# Patient Record
Sex: Male | Born: 1960
Health system: Southern US, Community
[De-identification: ages and names within clinical notes are randomized; demographics above are authoritative.]

## PROBLEM LIST (undated history)

## (undated) DIAGNOSIS — F39 Unspecified mood [affective] disorder: Secondary | ICD-10-CM

## (undated) DIAGNOSIS — E785 Hyperlipidemia, unspecified: Secondary | ICD-10-CM

## (undated) DIAGNOSIS — F329 Major depressive disorder, single episode, unspecified: Secondary | ICD-10-CM

## (undated) DIAGNOSIS — F419 Anxiety disorder, unspecified: Secondary | ICD-10-CM

## (undated) DIAGNOSIS — F32A Depression, unspecified: Secondary | ICD-10-CM

## (undated) HISTORY — DX: Major depressive disorder, single episode, unspecified: F32.9

## (undated) HISTORY — PX: OTHER SURGICAL HISTORY: SHX169

## (undated) HISTORY — PX: SHOULDER SURGERY: SHX246

## (undated) HISTORY — DX: Anxiety disorder, unspecified: F41.9

## (undated) HISTORY — DX: Hyperlipidemia, unspecified: E78.5

## (undated) HISTORY — DX: Depression, unspecified: F32.A

---

## 2009-04-20 ENCOUNTER — Ambulatory Visit: Payer: Self-pay | Admitting: Family Medicine

## 2009-04-20 ENCOUNTER — Encounter: Payer: Self-pay | Admitting: Occupational Medicine

## 2009-04-20 DIAGNOSIS — R110 Nausea: Secondary | ICD-10-CM | POA: Insufficient documentation

## 2009-04-20 DIAGNOSIS — R197 Diarrhea, unspecified: Secondary | ICD-10-CM

## 2009-04-20 DIAGNOSIS — R1011 Right upper quadrant pain: Secondary | ICD-10-CM | POA: Insufficient documentation

## 2009-04-20 LAB — CONVERTED CEMR LAB
ALT: 41 units/L (ref 0–53)
AST: 35 units/L (ref 0–37)
Albumin: 4.8 g/dL (ref 3.5–5.2)
Calcium: 9.9 mg/dL (ref 8.4–10.5)
Chloride: 102 meq/L (ref 96–112)
HCT: 43.2 % (ref 39.0–52.0)
Lymphocytes Relative: 29 % (ref 12–46)
Lymphs Abs: 1.7 10*3/uL (ref 0.7–4.0)
Neutrophils Relative %: 53 % (ref 43–77)
Platelets: 192 10*3/uL (ref 150–400)
Potassium: 4.6 meq/L (ref 3.5–5.3)
RBC: 4.81 M/uL (ref 4.22–5.81)
Total Protein: 7.6 g/dL (ref 6.0–8.3)
WBC: 5.7 10*3/uL (ref 4.0–10.5)

## 2009-04-21 ENCOUNTER — Telehealth: Payer: Self-pay | Admitting: Occupational Medicine

## 2009-04-21 ENCOUNTER — Emergency Department (HOSPITAL_BASED_OUTPATIENT_CLINIC_OR_DEPARTMENT_OTHER): Admission: EM | Admit: 2009-04-21 | Discharge: 2009-04-21 | Payer: Self-pay | Admitting: Emergency Medicine

## 2009-04-21 ENCOUNTER — Ambulatory Visit: Payer: Self-pay | Admitting: Diagnostic Radiology

## 2010-06-23 ENCOUNTER — Ambulatory Visit (HOSPITAL_COMMUNITY): Payer: Self-pay | Admitting: Psychology

## 2010-09-23 ENCOUNTER — Ambulatory Visit (HOSPITAL_COMMUNITY): Admit: 2010-09-23 | Payer: Self-pay | Admitting: Psychiatry

## 2010-09-23 ENCOUNTER — Ambulatory Visit (INDEPENDENT_AMBULATORY_CARE_PROVIDER_SITE_OTHER): Payer: 59 | Admitting: Psychiatry

## 2010-09-23 DIAGNOSIS — F411 Generalized anxiety disorder: Secondary | ICD-10-CM

## 2010-09-23 DIAGNOSIS — F339 Major depressive disorder, recurrent, unspecified: Secondary | ICD-10-CM

## 2010-10-12 ENCOUNTER — Ambulatory Visit (HOSPITAL_COMMUNITY): Payer: 59 | Admitting: Psychology

## 2010-10-12 ENCOUNTER — Encounter (HOSPITAL_COMMUNITY): Payer: 59 | Admitting: Psychology

## 2010-11-06 ENCOUNTER — Encounter (INDEPENDENT_AMBULATORY_CARE_PROVIDER_SITE_OTHER): Payer: 59 | Admitting: Psychiatry

## 2010-11-06 DIAGNOSIS — F339 Major depressive disorder, recurrent, unspecified: Secondary | ICD-10-CM

## 2010-11-06 DIAGNOSIS — F411 Generalized anxiety disorder: Secondary | ICD-10-CM

## 2010-11-28 LAB — URINALYSIS, ROUTINE W REFLEX MICROSCOPIC
Glucose, UA: NEGATIVE mg/dL
Ketones, ur: NEGATIVE mg/dL
Specific Gravity, Urine: 1.02 (ref 1.005–1.030)
pH: 5.5 (ref 5.0–8.0)

## 2010-11-28 LAB — COMPREHENSIVE METABOLIC PANEL
ALT: 42 U/L (ref 0–53)
AST: 39 U/L — ABNORMAL HIGH (ref 0–37)
Calcium: 9.7 mg/dL (ref 8.4–10.5)
GFR calc Af Amer: >60 mL/min (ref >60)
Glucose, Bld: 73 mg/dL (ref 70–99)
Sodium: 143 mEq/L (ref 135–145)
Total Protein: 8.5 g/dL — ABNORMAL HIGH (ref 6.0–8.3)

## 2010-11-28 LAB — DIFFERENTIAL
Eosinophils Absolute: 0.5 10*3/uL (ref 0.0–0.7)
Lymphs Abs: 1.9 10*3/uL (ref 0.7–4.0)
Monocytes Relative: 11 % (ref 3–12)
Neutrophils Relative %: 56 % (ref 43–77)

## 2010-11-28 LAB — CBC
MCHC: 34.4 g/dL (ref 30.0–36.0)
RDW: 12 % (ref 11.5–15.5)

## 2011-01-07 ENCOUNTER — Encounter (INDEPENDENT_AMBULATORY_CARE_PROVIDER_SITE_OTHER): Payer: 59 | Admitting: Psychiatry

## 2011-01-07 DIAGNOSIS — F341 Dysthymic disorder: Secondary | ICD-10-CM

## 2011-02-03 LAB — HM COLONOSCOPY

## 2011-04-06 ENCOUNTER — Encounter (INDEPENDENT_AMBULATORY_CARE_PROVIDER_SITE_OTHER): Payer: 59 | Admitting: Psychiatry

## 2011-04-06 DIAGNOSIS — F339 Major depressive disorder, recurrent, unspecified: Secondary | ICD-10-CM

## 2011-08-03 ENCOUNTER — Encounter (HOSPITAL_COMMUNITY): Payer: Self-pay

## 2011-08-06 ENCOUNTER — Ambulatory Visit (INDEPENDENT_AMBULATORY_CARE_PROVIDER_SITE_OTHER): Payer: 59 | Admitting: Psychiatry

## 2011-08-06 ENCOUNTER — Encounter (HOSPITAL_COMMUNITY): Payer: Self-pay | Admitting: Psychiatry

## 2011-08-06 VITALS — BP 120/80 | Ht 68.0 in | Wt 230.0 lb

## 2011-08-06 DIAGNOSIS — F329 Major depressive disorder, single episode, unspecified: Secondary | ICD-10-CM

## 2011-08-06 DIAGNOSIS — F411 Generalized anxiety disorder: Secondary | ICD-10-CM
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
## 2011-09-24 ENCOUNTER — Emergency Department
Admission: EM | Admit: 2011-09-24 | Discharge: 2011-09-24 | Disposition: A | Discharge status: Home / Self Care | Payer: 59 | Source: Home / Self Care | Attending: Family Medicine | Admitting: Family Medicine

## 2011-09-24 ENCOUNTER — Encounter: Payer: Self-pay | Admitting: *Deleted

## 2011-09-24 DIAGNOSIS — S61512A Laceration without foreign body of left wrist, initial encounter: Secondary | ICD-10-CM

## 2011-09-24 DIAGNOSIS — S61509A Unspecified open wound of unspecified wrist, initial encounter: Secondary | ICD-10-CM

## 2011-09-24 HISTORY — DX: Unspecified mood (affective) disorder: F39

## 2011-09-24 HISTORY — DX: Hyperlipidemia, unspecified: E78.5

## 2011-09-24 NOTE — ED Notes (Signed)
Pt c/o LT wrist laceration x this AM. Tdap is up to date.

## 2011-09-24 NOTE — ED Provider Notes (Signed)
History     CSN: [id number]  Arrival date & time 09/24/11  1308   First MD Initiated Contact with Patient 09/24/11 1149      Chief Complaint  Patient presents with  . Extremity Laceration      HPI Comments: While using a hand held electric grinder this morning, the grinding disc broke and cut his left wrist.  Patient is a 51 y.o. male presenting with skin laceration. The history is provided by the patient.  Laceration  The incident occurred 3 to 5 hours ago. Pain location: left wrist. The laceration is 2 cm in size. Injury mechanism: grinding wheel. The pain has been improving since onset. He reports no foreign bodies present. His tetanus status is UTD.    Past Medical History  Diagnosis Date  . Anxiety   . Depression   . Hyperlipidemia   . Mood disorder   . Hyperlipemia     Past Surgical History  Procedure Date  . Left shoulder surgery   . Shoulder surgery     Family History  Problem Relation Age of Onset  . Bipolar disorder Mother   . Heart failure Mother   . Hypertension Mother   . Cancer Father     throat, prostate    History  Substance Use Topics  . Smoking status: Never Smoker   . Smokeless tobacco: Never Used  . Alcohol Use: No      Review of Systems  All other systems reviewed and are negative.    Allergies  Review of patient's allergies indicates no known allergies.  Home Medications   Current Outpatient Rx  Name Route Sig Dispense Refill  . ESCITALOPRAM OXALATE 10 MG PO TABS Oral Take 10 mg by mouth daily.    Marland Kitchen ROSUVASTATIN CALCIUM 10 MG PO TABS Oral Take 10 mg by mouth daily.    . SERTRALINE HCL 50 MG PO TABS Oral Take 50 mg by mouth daily.        BP 133/84  Pulse 60  Temp(Src) 98 F (36.7 C) (Oral)  Resp 18  Ht 5' 9.5" (1.765 m)  Wt 228 lb 4 oz (103.534 kg)  BMI 33.22 kg/m2  SpO2 100%  Physical Exam  Nursing note and vitals reviewed. Constitutional: He appears well-developed and well-nourished. No distress.    Musculoskeletal:       Left wrist: He exhibits tenderness and laceration. He exhibits normal range of motion, no bony tenderness, no swelling, no crepitus and no deformity.       Arms:      Left volar wrist reveals a superficial 2cm long laceration.  Wound appears clean without debris.  Wrist has full range of motion.  Distal Neurovascular function is intact.     ED Course  Procedures  Laceration Repair Discussed benefits and risks of procedure and verbal consent obtained. Using sterile technique and local digital 1% lidocaine with epinephrine, cleansed wound with Betadine followed copious lavage with normal saline.  Wound carefully inspected for debris and foreign bodies; none found.  Wound closed with #3, 4-0 interrupted nylon sutures.  Bacitracin and non-stick sterile dressing applied.  Wound precautions explained to patient.  Return for suture removal in 10 days.      1. Laceration of wrist, left       MDM  Wound precautions discussed.  Return in 10 days for suture removal.  Keep wound bandaged and change daily.  Apply Bacitracin.  Return for any signs of infection  Donna Christen, MD 09/24/11 1239

## 2011-10-01 ENCOUNTER — Emergency Department (INDEPENDENT_AMBULATORY_CARE_PROVIDER_SITE_OTHER)
Admission: EM | Admit: 2011-10-01 | Discharge: 2011-10-01 | Disposition: A | Discharge status: Home / Self Care | Payer: 59 | Source: Home / Self Care | Attending: Emergency Medicine | Admitting: Emergency Medicine

## 2011-10-01 DIAGNOSIS — S61509A Unspecified open wound of unspecified wrist, initial encounter: Secondary | ICD-10-CM

## 2011-10-01 NOTE — ED Provider Notes (Signed)
History     CSN: [id number]  Arrival date & time 10/01/11  1102   First MD Initiated Contact with Patient 10/01/11 1111      No chief complaint on file.   (Consider location/radiation/quality/duration/timing/severity/associated sxs/prior treatment) HPI He returns to clinic 7 days after his laceration repair for suture removal. He has not been having any problems, no pain, no disability with range of motion, weakness, or tingling. No fever, chills, redness or drainage.  Past Medical History  Diagnosis Date  . Anxiety   . Depression   . Hyperlipidemia   . Mood disorder   . Hyperlipemia     Past Surgical History  Procedure Date  . Left shoulder surgery   . Shoulder surgery     Family History  Problem Relation Age of Onset  . Bipolar disorder Mother   . Heart failure Mother   . Hypertension Mother   . Cancer Father     throat, prostate    History  Substance Use Topics  . Smoking status: Never Smoker   . Smokeless tobacco: Never Used  . Alcohol Use: No      Review of Systems  Allergies  Review of patient's allergies indicates no known allergies.  Home Medications   Current Outpatient Rx  Name Route Sig Dispense Refill  . ESCITALOPRAM OXALATE 10 MG PO TABS Oral Take 10 mg by mouth daily.    Marland Kitchen ROSUVASTATIN CALCIUM 10 MG PO TABS Oral Take 10 mg by mouth daily.    . SERTRALINE HCL 50 MG PO TABS Oral Take 50 mg by mouth daily.        There were no vitals taken for this visit.  Physical Exam  Nursing note and vitals reviewed. Constitutional: He is oriented to person, place, and time. He appears well-developed and well-nourished.  HENT:  Head: Normocephalic and atraumatic.  Eyes: No scleral icterus.  Neck: Neck supple.  Cardiovascular: Regular rhythm and normal heart sounds.   Pulmonary/Chest: Effort normal and breath sounds normal. No respiratory distress.  Neurological: He is alert and oriented to person, place, and time.  Skin: Skin is warm and dry.       Skin on the volar left wrist shows healing laceration. 3 sutures are intact and are removed. No wound dehiscence, no signs of infection, no tenderness.  Psychiatric: He has a normal mood and affect. His speech is normal.    ED Course  Procedures (including critical care time)  Labs Reviewed - No data to display No results found.   No diagnosis found.    MDM   3 sutures are removed without any problem. Wound precautions given. Followup as needed.   Lily Kocher, MD 10/01/11 [phone]

## 2011-11-01 ENCOUNTER — Other Ambulatory Visit (HOSPITAL_COMMUNITY): Payer: Self-pay | Admitting: Psychiatry

## 2011-11-01 MED ORDER — SERTRALINE HCL 50 MG PO TABS: 50.0000 mg | ORAL_TABLET | Freq: Every day | ORAL | Status: DC

## 2012-02-03 ENCOUNTER — Ambulatory Visit (INDEPENDENT_AMBULATORY_CARE_PROVIDER_SITE_OTHER): Payer: 59 | Admitting: Psychiatry

## 2012-02-03 ENCOUNTER — Encounter (HOSPITAL_COMMUNITY): Payer: Self-pay | Admitting: Psychiatry

## 2012-02-03 VITALS — BP 122/82 | Ht 68.0 in | Wt 220.0 lb

## 2012-02-03 DIAGNOSIS — F329 Major depressive disorder, single episode, unspecified: Secondary | ICD-10-CM

## 2012-02-03 DIAGNOSIS — F411 Generalized anxiety disorder: Secondary | ICD-10-CM

## 2012-02-03 DIAGNOSIS — F331 Major depressive disorder, recurrent, moderate: Secondary | ICD-10-CM
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
## 2012-05-08 ENCOUNTER — Telehealth (HOSPITAL_COMMUNITY): Payer: Self-pay | Admitting: Psychiatry

## 2012-05-08 MED ORDER — SERTRALINE HCL 50 MG PO TABS: 50.0000 mg | ORAL_TABLET | Freq: Every day | ORAL | Status: DC

## 2012-05-08 NOTE — Telephone Encounter (Signed)
Received fax request for Zoloft. Patient is scheduled for follow up on 08/04/2012. The patient reports he is doing well on sertraline and continues to take the medication. He denies any SI/HI or medication side effects.   PLAN: Informed patient of his follow up appointment will fill Sertraline 50 mg #30 with 2 refills.  Asked patient to call clinic with any problems.

## 2012-08-04 ENCOUNTER — Ambulatory Visit (HOSPITAL_COMMUNITY): Payer: Self-pay | Admitting: Psychiatry

## 2012-08-18 ENCOUNTER — Ambulatory Visit (INDEPENDENT_AMBULATORY_CARE_PROVIDER_SITE_OTHER): Payer: 59 | Admitting: Psychiatry

## 2012-08-18 ENCOUNTER — Encounter (HOSPITAL_COMMUNITY): Payer: Self-pay | Admitting: Psychiatry

## 2012-08-18 VITALS — BP 120/80 | Ht 68.0 in | Wt 226.0 lb

## 2012-08-18 DIAGNOSIS — F411 Generalized anxiety disorder: Secondary | ICD-10-CM

## 2012-08-18 DIAGNOSIS — F331 Major depressive disorder, recurrent, moderate: Secondary | ICD-10-CM

## 2012-08-18 NOTE — Progress Notes (Signed)
   [facility] Behavioral Health Follow-up Outpatient Visit  Shawn Lloyd Mar 16, 1961   Subjective: The patient is a 51 year old male who has been followed by [facility] since November of 2011. I have been treating him since February 2012. The patient is currently diagnosed with major depressive disorder recurrent moderate along with generalized anxiety disorder. He has been stable on Zoloft since I began treating him. At his last appointment, there were a lot of changes at work. He has a new chief and a Science writer. There was new administration. Another stressor was his son. His son graduated from college and went to a internship out [place]. The patient reports today that this son was offered a full-time position. He has been out [place] since. He did come home for a week for Christmas. His wife had a falling out with one of her best friends. She has not been the same. The patient is still undergoing changes at work. They're creating a training chief position. The patient has been asked to apply for it. If he takes it, his work schedule we'll change from 24 hour shifts into a Monday through Friday 8 hours per day job. The money will not increase, because the patient would not be paid over time. He is trying to decide. He is to have his paperwork in by the end of the day. He endorses good sleep and appetite. He feels that he is handling stress. He reports that he had to put down his 1 year old dog. There are manageable side effects of Zoloft including sexual side effects.  Filed Vitals:   08/18/12 1436  BP: 120/80    Mental Status Examination  Appearance: Casually dressed Alert: Yes Attention: good  Cooperative: Yes Eye Contact: Fair Speech: Regular rate rhythm and volume Psychomotor Activity: Normal Memory/Concentration: Intact Oriented: person, place, time/date and situation Mood: Euthymic Affect: Full Range Thought Processes and Associations: Logical Fund of Knowledge:  Fair Thought Content: No suicidal or homicidal thought Insight: Fair Judgement: Fair  Diagnosis: Generalized anxiety disorder, major depressive disorder, recurrent, moderate  Treatment Plan: I will continue the Zoloft at 50 mg daily. I will check on the patient in 3 months. Patient may call with concerns. Patient update whether or not he takes the job. Jamse Mead, MD

## 2012-09-01 ENCOUNTER — Other Ambulatory Visit (HOSPITAL_COMMUNITY): Payer: Self-pay | Admitting: Psychiatry

## 2012-09-01 MED ORDER — SERTRALINE HCL 50 MG PO TABS: 50.0000 mg | ORAL_TABLET | Freq: Every day | ORAL | Status: DC

## 2012-11-17 ENCOUNTER — Encounter (HOSPITAL_COMMUNITY): Payer: Self-pay | Admitting: Psychiatry

## 2012-11-17 ENCOUNTER — Ambulatory Visit (INDEPENDENT_AMBULATORY_CARE_PROVIDER_SITE_OTHER): Payer: 59 | Admitting: Psychiatry

## 2012-11-17 VITALS — BP 114/72 | Ht 68.0 in | Wt 222.0 lb

## 2012-11-17 DIAGNOSIS — F331 Major depressive disorder, recurrent, moderate: Secondary | ICD-10-CM

## 2012-11-17 DIAGNOSIS — F411 Generalized anxiety disorder: Secondary | ICD-10-CM

## 2012-11-17 MED ORDER — SERTRALINE HCL 50 MG PO TABS: 50.0000 mg | ORAL_TABLET | Freq: Every day | ORAL | Status: DC

## 2012-11-17 NOTE — Progress Notes (Signed)
[facility] Behavioral Health Follow-up Outpatient Visit  Shawn Lloyd 1960-09-23   Subjective: The patient is a 52 year old male who has been followed by [facility] since November of 2011. I have been treating him since February 2012. The patient is currently diagnosed with major depressive disorder recurrent moderate along with generalized anxiety disorder. At his last appointment, I did not make any changes. He presents today with his wife. 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 He states that at work they were kind of mad at him for a while. They really want him to take it. The patient continues to work 2 jobs. He works the equivalent of 90 hours per week. 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 The patient does tend to work late. He comes home and wants to get asleep. He falls asleep okay. He will wake up in the night and let the dog out. He is able to go back to sleep. He is down 4 pounds today. 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 He gets yearly physicals. He is very had his blood work done for this year but does not know the results yet. He does have an EKG and pulmonary function tests pending. His wife reports that he continues to get your double. She feels that he will take it on her. The patient does not see that he is getting irritable. He tends to shut down. The patient'is wife feels that she's last priority. They came up with a safeword this past week. If he is feeling stressed out, or if she sees it, they will say to each other "flat tire". This relieves attention. It appears to be helping. The patient denies any depression. He feels his anxiety is manageable.  Filed Vitals:   11/17/12 1016  BP: 114/72   Active Ambulatory Problems    Diagnosis Date Noted  . NAUSEA 04/20/2009  . DIARRHEA 04/20/2009  . RUQ PAIN 04/20/2009  . GAD (generalized anxiety disorder) 02/03/2012  . MDD (major  depressive disorder) 02/03/2012   Resolved Ambulatory Problems    Diagnosis Date Noted  . No Resolved Ambulatory Problems   Past Medical History  Diagnosis Date  . Anxiety   . Depression   . Hyperlipidemia   . Mood disorder   . Hyperlipemia    Current Outpatient Prescriptions on File Prior to Visit  Medication Sig Dispense Refill  . rosuvastatin (CRESTOR) 10 MG tablet Take 10 mg by mouth daily.      . [DISCONTINUED] escitalopram (LEXAPRO) 10 MG tablet Take 10 mg by mouth daily.       No current facility-administered medications on file prior to visit.   Review of Systems - General ROS: positive for  - sleep disturbance and weight loss Psychological ROS: positive for - anxiety Cardiovascular ROS: no chest pain or dyspnea on exertion Neurological ROS: negative for - headaches, memory loss or weakness  Mental Status Examination  Appearance: Casually dressed Alert: Yes Attention: good  Cooperative: Yes Eye Contact: Fair Speech: Regular rate rhythm and volume Psychomotor Activity: Normal Memory/Concentration: Intact Oriented: person, place, time/date and situation Mood: Euthymic Affect: Full Range Thought Processes and Associations: Logical Fund of Knowledge: Fair Thought Content: No suicidal or homicidal thought Insight: Fair Judgement: Fair  Diagnosis: Generalized anxiety disorder, major depressive disorder, recurrent, moderate  Treatment Plan: I will continue the Zoloft at 50 mg daily. I will check on the patient in 3  months. Patient may call with concerns. If patient's spouse are still having issues next appointment, I will refer for marriage counseling. Jamse Mead, MD

## 2013-02-14 ENCOUNTER — Encounter (HOSPITAL_COMMUNITY): Payer: Self-pay | Admitting: Psychiatry

## 2013-02-14 ENCOUNTER — Ambulatory Visit (INDEPENDENT_AMBULATORY_CARE_PROVIDER_SITE_OTHER): Payer: 59 | Admitting: Psychiatry

## 2013-02-14 VITALS — BP 116/75 | Ht 68.0 in | Wt 212.0 lb

## 2013-02-14 DIAGNOSIS — F411 Generalized anxiety disorder: Secondary | ICD-10-CM

## 2013-02-14 DIAGNOSIS — F331 Major depressive disorder, recurrent, moderate: Secondary | ICD-10-CM

## 2013-02-14 MED ORDER — SERTRALINE HCL 100 MG PO TABS: 100.0000 mg | ORAL_TABLET | Freq: Every day | ORAL | Status: DC
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
## 2013-03-11 ENCOUNTER — Other Ambulatory Visit (HOSPITAL_COMMUNITY): Payer: Self-pay | Admitting: Psychiatry

## 2013-03-28 ENCOUNTER — Ambulatory Visit (INDEPENDENT_AMBULATORY_CARE_PROVIDER_SITE_OTHER): Payer: BC Managed Care – PPO | Admitting: Psychiatry

## 2013-03-28 ENCOUNTER — Encounter (HOSPITAL_COMMUNITY): Payer: Self-pay | Admitting: Psychiatry

## 2013-03-28 VITALS — BP 118/78 | Ht 68.0 in | Wt 208.0 lb

## 2013-03-28 DIAGNOSIS — F411 Generalized anxiety disorder: Secondary | ICD-10-CM

## 2013-03-28 DIAGNOSIS — F331 Major depressive disorder, recurrent, moderate: Secondary | ICD-10-CM

## 2013-03-28 NOTE — Progress Notes (Signed)
   [facility] Behavioral Health Follow-up Outpatient Visit  KYCE GING [birth year]   Subjective: The patient is a 52 year old male who has been followed by [facility] since November of 2011. I have been treating him since February 2012. The patient is currently diagnosed with major depressive disorder recurrent moderate along with generalized anxiety disorder. At his last appointment, I increase Zoloft to 100 mg daily secondary to increased anxiety. He presents today with his wife. He continues to workout. He is down 4 more pounds. He is working a lot. He is still finding time to get out and be social. Delene Loll gone to [employer], and they socialize with friends. They have a boat and will go out on the lake. He and his wife are doing well. His anxiety is much better. His wife states that there was a time she almost packed her bags. She states that she now sees him as much happier. He's easy to get along with. He is more socially interactive. The patient feels that his anxiety is much better. He sleeping at night. He is watching what he eats. He still has trouble sleeping at the fire hall. Overall he is doing much better.  Filed Vitals:   03/28/13 1423  BP: 118/78   Active Ambulatory Problems    Diagnosis Date Noted  . NAUSEA 04/20/2009  . DIARRHEA 04/20/2009  . RUQ PAIN 04/20/2009  . GAD (generalized anxiety disorder) 02/03/2012  . MDD (major depressive disorder) 02/03/2012   Resolved Ambulatory Problems    Diagnosis Date Noted  . No Resolved Ambulatory Problems   Past Medical History  Diagnosis Date  . Anxiety   . Depression   . Hyperlipidemia   . Mood disorder   . Hyperlipemia    Current Outpatient Prescriptions on File Prior to Visit  Medication Sig Dispense Refill  . rosuvastatin (CRESTOR) 10 MG tablet Take 10 mg by mouth daily.      . sertraline (ZOLOFT) 100 MG tablet Take 1 tablet (100 mg total) by mouth daily.  30 tablet  2  . [DISCONTINUED] escitalopram  (LEXAPRO) 10 MG tablet Take 10 mg by mouth daily.       No current facility-administered medications on file prior to visit.   Review of Systems - General ROS: positive for  - sleep disturbance and weight loss Psychological ROS: positive for - anxiety Cardiovascular ROS: no chest pain or dyspnea on exertion Neurological ROS: negative for - headaches, memory loss or weakness Musculoskeletal: Strength and tone within normal limits, gait and station normal.  Mental Status Examination  Appearance: Casually dressed Alert: Yes Attention: good  Cooperative: Yes Eye Contact: Fair Speech: Regular rate rhythm and volume Psychomotor Activity: Normal Memory/Concentration: Intact Oriented: person, place, time/date and situation Mood: Euthymic Affect: Full Range Thought Processes and Associations: Logical Fund of Knowledge: Fair Thought Content: No suicidal or homicidal thought Insight: Fair Judgement: Fair  Diagnosis: Generalized anxiety disorder, major depressive disorder, recurrent, moderate  Treatment Plan: I will continue Zoloft 100 mg daily. I will see the patient back in 3 months. Patient may call with concerns. Jamse Mead, MD

## 2013-05-14 ENCOUNTER — Emergency Department (INDEPENDENT_AMBULATORY_CARE_PROVIDER_SITE_OTHER): Payer: BC Managed Care – PPO

## 2013-05-14 ENCOUNTER — Encounter: Payer: Self-pay | Admitting: Emergency Medicine

## 2013-05-14 ENCOUNTER — Emergency Department
Admission: EM | Admit: 2013-05-14 | Discharge: 2013-05-14 | Disposition: A | Discharge status: Home / Self Care | Payer: BC Managed Care – PPO | Source: Home / Self Care | Attending: Emergency Medicine | Admitting: Emergency Medicine

## 2013-05-14 DIAGNOSIS — R079 Chest pain, unspecified: Secondary | ICD-10-CM

## 2013-05-14 DIAGNOSIS — R071 Chest pain on breathing: Secondary | ICD-10-CM

## 2013-05-14 NOTE — ED Notes (Signed)
Patient c/o chest pain started this morning at 6 am. Denies, nausea, sweats, or pain in arm.

## 2013-05-14 NOTE — ED Provider Notes (Signed)
CSN: [id number]     Arrival date & time 05/14/13  0932 History   First MD Initiated Contact with Patient 05/14/13 [phone]     Chief Complaint  Patient presents with  . Chest Pain   Patient is a 52 y.o. male presenting with chest pain. The history is provided by the patient and the spouse.  Chest Pain Pain location:  L lateral chest Pain quality: sharp   Pain radiates to:  Does not radiate Pain radiates to the back: no   Pain severity:  Mild Onset quality:  Gradual Duration:  3 hours Timing:  Intermittent Progression:  Improving Chronicity:  New Context: raising an arm (Left arm,)   Context: not eating   Context comment:  When taking a deep breath.--He denies any exertional chest pain Relieved by:  None tried Associated symptoms: anxiety (Occasional, mild)   Associated symptoms: no abdominal pain, no altered mental status, no back pain, no claudication, no cough, no diaphoresis, no dizziness, no dysphagia, no fever, no headache, no lower extremity edema, no nausea, no near-syncope, no numbness, no palpitations, no shortness of breath, no syncope and not vomiting   Associated symptoms comment:  He denies any current left arm or left shoulder pain, although he has some chronic arthritis left shoulder Risk factors: high cholesterol    Had normal routine exercise treadmill test with imaging at cardiologist one year ago. He also had normal routine annual ETT for firefighters to 6 months ago.  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 He is here with wife. Past Medical History  Diagnosis Date  . Anxiety   . Depression   . Hyperlipidemia   . Mood disorder   . Hyperlipemia    Past Surgical History  Procedure Laterality Date  . Left shoulder surgery    . Shoulder surgery     Family History  Problem Relation Age of Onset  . Bipolar disorder Mother   . Heart failure Mother   .  Hypertension Mother   . Cancer Father     throat, prostate   History  Substance Use Topics  . Smoking status: Never Smoker   . Smokeless tobacco: Never Used  . Alcohol Use: No    Review of Systems  Constitutional: Negative for fever and diaphoresis.  HENT: Negative for trouble swallowing.   Respiratory: Negative for cough and shortness of breath.   Cardiovascular: Positive for chest pain. Negative for palpitations, claudication, syncope and near-syncope.  Gastrointestinal: Negative for nausea, vomiting and abdominal pain.  Musculoskeletal: Negative for back pain.  Neurological: Negative for dizziness, numbness and headaches.  All other systems reviewed and are negative.    Allergies  Review of patient's allergies indicates not on file.  Home Medications   Current Outpatient Rx  Name  Route  Sig  Dispense  Refill  . rosuvastatin (CRESTOR) 10 MG tablet   Oral   Take 10 mg by mouth daily.         . sertraline (ZOLOFT) 100 MG tablet   Oral   Take 1 tablet (100 mg total) by mouth daily.   30 tablet   2    BP 132/88  Pulse 72  Temp(Src) 98.3 F (36.8 C) (Oral)  Ht 5\' 8"  (1.727 m)  Wt 198 lb (89.812 kg)  BMI 30.11 kg/m2  SpO2 99% Physical Exam  Nursing note and vitals reviewed. Constitutional: He is  oriented to person, place, and time. He appears well-developed and well-nourished. No distress.  HENT:  Head: Normocephalic and atraumatic.  Eyes: Pupils are equal, round, and reactive to light. No scleral icterus.  Neck: Normal range of motion. Neck supple. No JVD present.  Cardiovascular: Normal rate, regular rhythm and normal heart sounds.  Exam reveals no gallop and no friction rub.   No murmur heard. Pulmonary/Chest: Effort normal and breath sounds normal. No respiratory distress. He has no wheezes. He has no rales. He exhibits no tenderness.  Abdominal: Soft. He exhibits no mass. There is no tenderness. There is no rebound and no guarding.  Musculoskeletal:  Normal range of motion. He exhibits no edema and no tenderness.  Lymphadenopathy:    He has no cervical adenopathy.  Neurological: He is alert and oriented to person, place, and time. No cranial nerve deficit.  Skin: Skin is warm and dry. No rash noted.  Psychiatric: He has a normal mood and affect.    ED Course  ED EKG  Date/Time: 05/14/2013 1:51 PM Performed by: Georgina Pillion, DAVID Authorized by: Lajean Manes Interpreted by ED physician Comparison: not compared with previous ECG  Previous ECG: no previous ECG available Rhythm: sinus rhythm Ectopy comments: None Rate: normal BPM: 63 QRS axis: normal Conduction: conduction normal ST Segments: ST segments normal T Waves: T waves normal Other: no other findings Clinical impression: normal ECG   Labs Review Labs Reviewed - No data to display Imaging Review Dg Chest 2 View  05/14/2013   CLINICAL DATA:  Left anterior chest pain today  EXAM: CHEST  2 VIEW  COMPARISON:  None.  FINDINGS: No active infiltrate or effusion is seen. Mediastinal contours appear normal. The heart is within normal limits in size. No bony abnormality is seen. There is however degenerative change noted involving the left shoulder with loss of joint space and spurring.  IMPRESSION: 1. No active lung disease. 2. Degenerative change in the left shoulder.   Electronically Signed   By: Dwyane Dee M.D.   On: 05/14/2013 10:41    MDM   1. Chest pain, unspecified    We reviewed normal EKG and chest x-ray with patient and wife. Clinically, there is no evidence of cardiorespiratory cause for the chest discomfort. He likely has deep chest muscle strain or mild pleuritic inflammation, but no evidence of infection. He's had normal ETT by cardiologist within the past year. We discussed workup and treatment options. In my opinion, no further workup needed at this time unless he were to have change in symptoms. After risks, benefits, alternatives discussed, patient and wife  agree with above and the following treatment plan: Rest, symptomatic care. Ibuprofen 600 mg 3 times a day p.c. Precautions discussed. Red flags discussed. Questions invited and answered. Patient and wife voiced understanding and agreement.    Lajean Manes, MD 05/14/13 [phone]

## 2013-05-16 ENCOUNTER — Telehealth: Payer: Self-pay | Admitting: *Deleted

## 2013-05-24 ENCOUNTER — Other Ambulatory Visit (HOSPITAL_COMMUNITY): Payer: Self-pay | Admitting: Psychiatry

## 2013-06-20 ENCOUNTER — Encounter (HOSPITAL_COMMUNITY): Payer: Self-pay | Admitting: Psychiatry

## 2013-06-20 ENCOUNTER — Encounter (INDEPENDENT_AMBULATORY_CARE_PROVIDER_SITE_OTHER): Payer: Self-pay

## 2013-06-20 ENCOUNTER — Ambulatory Visit (INDEPENDENT_AMBULATORY_CARE_PROVIDER_SITE_OTHER): Payer: BC Managed Care – PPO | Admitting: Psychiatry

## 2013-06-20 VITALS — BP 112/78 | Ht 68.0 in | Wt 206.0 lb

## 2013-06-20 DIAGNOSIS — F411 Generalized anxiety disorder: Secondary | ICD-10-CM

## 2013-06-20 DIAGNOSIS — F331 Major depressive disorder, recurrent, moderate: Secondary | ICD-10-CM

## 2013-06-20 MED ORDER — SERTRALINE HCL 100 MG PO TABS: ORAL_TABLET | ORAL | Status: DC

## 2013-06-20 NOTE — Progress Notes (Signed)
   [facility] [place] [facility] Follow-up Outpatient Visit  Shawn Lloyd [birth year]   Subjective: The patient is a 52 year old male who has been followed by [facility] since November of 2011. I have been treating him since February 2012. The patient is currently diagnosed with major depressive disorder recurrent moderate along with generalized anxiety disorder. At his last appointment, I did not make any changes. He presents today with his wife. 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 They have 7 cows and 2 worsens. He last report since 3 weeks ago. Things have been very busy at work and they're looking at hiring another staff member. The patient went fishing at the beach 2 weeks ago. 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 He is sleeping "okay". He usually wakes about 2 times in the night. He is able to go back to sleep. His after the second time he goes back to sleep, he does not want to get up. The patient continues to follow a low carb diet. He is working out. He is down another 2 pounds today. He was able to go off his cholesterol medicine. His wife feels that he's doing well. There is no anxiety or depression.  Filed Vitals:   06/20/13 1343  BP: 112/78   Active Ambulatory Problems    Diagnosis Date Noted  . NAUSEA 04/20/2009  . DIARRHEA 04/20/2009  . RUQ PAIN 04/20/2009  . GAD (generalized anxiety disorder) 02/03/2012  . MDD (major depressive disorder) 02/03/2012   Resolved Ambulatory Problems    Diagnosis Date Noted  . No Resolved Ambulatory Problems   Past Medical History  Diagnosis Date  . Anxiety   . Depression   . Hyperlipidemia   . Mood disorder   . Hyperlipemia    Current Outpatient Prescriptions on File Prior to Visit  Medication Sig Dispense Refill  . [DISCONTINUED] escitalopram (LEXAPRO) 10 MG tablet Take 10 mg by mouth daily.       No current facility-administered medications on  file prior to visit.   Review of Systems - General ROS: positive for  - sleep disturbance and weight loss Psychological ROS: positive for - anxiety Cardiovascular ROS: no chest pain or dyspnea on exertion Neurological ROS: negative for - headaches, memory loss or weakness Musculoskeletal: Strength and tone within normal limits, gait and station normal.  Mental Status Examination  Appearance: Casually dressed Alert: Yes Attention: good  Cooperative: Yes Eye Contact: Fair Speech: Regular rate rhythm and volume Psychomotor Activity: Normal Memory/Concentration: Intact Oriented: person, place, time/date and situation Mood: Euthymic Affect: Full Range Thought Processes and Associations: Logical Fund of Knowledge: Fair Thought Content: No suicidal or homicidal thought Insight: Fair Judgement: Fair  Diagnosis: Generalized anxiety disorder, major depressive disorder, recurrent, moderate  Treatment Plan: I will continue Zoloft 100 mg daily. He'll return to clinic in 3 months. Patient may call with concerns. Jamse Mead, MD

## 2013-06-28 ENCOUNTER — Ambulatory Visit (HOSPITAL_COMMUNITY): Payer: Self-pay | Admitting: Psychiatry

## 2013-08-26 ENCOUNTER — Other Ambulatory Visit (HOSPITAL_COMMUNITY): Payer: Self-pay | Admitting: Psychiatry

## 2013-09-20 ENCOUNTER — Ambulatory Visit (HOSPITAL_COMMUNITY): Payer: Self-pay | Admitting: Psychiatry

## 2013-11-05 LAB — CBC AND DIFFERENTIAL
HCT: 40 % — AB (ref 41–53)
Hemoglobin: 13.4 g/dL — AB (ref 13.5–17.5)
Platelets: 250 10*3/uL (ref 150–399)
WBC: 6.8 10^3/mL

## 2013-11-05 LAB — BASIC METABOLIC PANEL
BUN: 31 mg/dL — AB (ref 4–21)
Creatinine: 0.9 mg/dL (ref 0.6–1.3)
GLUCOSE: 90 mg/dL
Potassium: 3.9 mmol/L (ref 3.4–5.3)
SODIUM: 139 mmol/L (ref 137–147)

## 2013-11-05 LAB — HEPATIC FUNCTION PANEL
ALK PHOS: 66 U/L (ref 25–125)
ALT: 25 U/L (ref 10–40)
AST: 15 U/L (ref 14–40)

## 2013-11-05 LAB — LIPID PANEL
Cholesterol: 204 mg/dL — AB (ref 0–200)
HDL: 46 mg/dL (ref 35–70)
LDL CALC: 143 mg/dL
TRIGLYCERIDES: 75 mg/dL (ref 40–160)

## 2013-11-05 LAB — PSA: PSA: 0.55

## 2013-11-05 LAB — CMP14+LP+1AC+CBC/D/PLT+TSH: CHLORIDE: 104 mmol/L

## 2013-11-28 ENCOUNTER — Ambulatory Visit (HOSPITAL_COMMUNITY): Payer: Self-pay | Admitting: Psychiatry

## 2013-11-28 ENCOUNTER — Other Ambulatory Visit (HOSPITAL_COMMUNITY): Payer: Self-pay | Admitting: Psychiatry

## 2013-11-30 ENCOUNTER — Telehealth (HOSPITAL_COMMUNITY): Payer: Self-pay

## 2013-11-30 MED ORDER — SERTRALINE HCL 100 MG PO TABS: ORAL_TABLET | ORAL | Status: DC

## 2013-11-30 NOTE — Telephone Encounter (Signed)
Called patient. Patient denies SI/HI/AVH. Will refill sertraline #30 with 2 refills.

## 2014-01-17 ENCOUNTER — Encounter: Payer: Self-pay | Admitting: Emergency Medicine

## 2014-01-17 ENCOUNTER — Emergency Department
Admission: EM | Admit: 2014-01-17 | Discharge: 2014-01-17 | Disposition: A | Discharge status: Home / Self Care | Payer: BC Managed Care – PPO | Source: Home / Self Care | Attending: Emergency Medicine | Admitting: Emergency Medicine

## 2014-01-17 DIAGNOSIS — J0100 Acute maxillary sinusitis, unspecified: Secondary | ICD-10-CM

## 2014-01-17 MED ORDER — AMOXICILLIN-POT CLAVULANATE 875-125 MG PO TABS: 1.0000 | ORAL_TABLET | Freq: Two times a day (BID) | ORAL | Status: DC

## 2014-01-17 NOTE — ED Notes (Signed)
Shawn Lloyd c/o sinus congestion, yellow drainage and right sided facial pain that started 1 week ago.

## 2014-01-17 NOTE — ED Provider Notes (Signed)
CSN: [id number]     Arrival date & time 01/17/14  1612 History   First MD Initiated Contact with Patient 01/17/14 1623     No chief complaint on file.  (Consider location/radiation/quality/duration/timing/severity/associated sxs/prior Treatment) HPI     Past Medical History  Diagnosis Date  . Anxiety   . Depression   . Hyperlipidemia   . Mood disorder   . Hyperlipemia    Past Surgical History  Procedure Laterality Date  . Left shoulder surgery    . Shoulder surgery     Family History  Problem Relation Age of Onset  . Bipolar disorder Mother   . Heart failure Mother   . Hypertension Mother   . Cancer Father     throat, prostate   History  Substance Use Topics  . Smoking status: Never Smoker   . Smokeless tobacco: Never Used  . Alcohol Use: No    Review of Systems  Allergies  Review of patient's allergies indicates not on file.  Home Medications   Prior to Admission medications   Medication Sig Start Date End Date Taking? Authorizing Provider  sertraline (ZOLOFT) 100 MG tablet TAKE 1 TABLET EVERY DAY 11/30/13   Larena Sox, MD   There were no vitals taken for this visit. Physical Exam  ED Course  Procedures (including critical care time) Labs Review Labs Reviewed - No data to display  Imaging Review No results found.   MDM  No diagnosis found. Patient was not seen by me. Patient was seen by Dr. Orson Aloe in urgent care.    Lajean Manes, MD 01/17/14 [phone]

## 2014-01-17 NOTE — ED Provider Notes (Addendum)
CSN: [id number]     Arrival date & time 01/17/14  1612 History   First MD Initiated Contact with Patient 01/17/14 1623     Chief Complaint  Patient presents with  . Sinus Problem   (Consider location/radiation/quality/duration/timing/severity/associated sxs/prior Treatment) HPI Shawn Lloyd is a 53 y.o. male who complains of onset of cold symptoms for 7 days.  The symptoms are constant and mild-moderate in severity.  Worsening, located R side of face. No history of allergic rhinitis. No sore throat No cough No pleuritic pain No wheezing No nasal congestion + post-nasal drainage +  R sinus pain/pressure (teeth pain, cheek pain, strange odor, yellow/green discharge R nostril) No chest congestion No itchy/red eyes No earache No hemoptysis No SOB No chills/sweats No fever No nausea No vomiting No abdominal pain No diarrhea No skin rashes No fatigue No myalgias No headache     Past Medical History  Diagnosis Date  . Anxiety   . Depression   . Hyperlipidemia   . Mood disorder   . Hyperlipemia    Past Surgical History  Procedure Laterality Date  . Left shoulder surgery    . Shoulder surgery     Family History  Problem Relation Age of Onset  . Bipolar disorder Mother   . Heart failure Mother   . Hypertension Mother   . Cancer Father     throat, prostate   History  Substance Use Topics  . Smoking status: Never Smoker   . Smokeless tobacco: Never Used  . Alcohol Use: No    Review of Systems  All other systems reviewed and are negative.   Allergies  Review of patient's allergies indicates no known allergies.  Home Medications   Prior to Admission medications   Medication Sig Start Date End Date Taking? Authorizing Provider  BuPROPion HCl (WELLBUTRIN PO) Take by mouth.   Yes Historical Provider, MD  amoxicillin-clavulanate (AUGMENTIN) 875-125 MG per tablet Take 1 tablet by mouth 2 (two) times daily. 01/17/14   Marlaine HindJeffrey H Macsen Nuttall, MD  sertraline (ZOLOFT) 100 MG  tablet TAKE 1 TABLET EVERY DAY 11/30/13   Larena SoxShaji J Puthuvel, MD   BP 137/89  Pulse 81  Temp(Src) 98.4 F (36.9 C) (Oral)  Resp 14  Wt 211 lb (95.709 kg)  SpO2 97% Physical Exam  Nursing note and vitals reviewed. Constitutional: He is oriented to person, place, and time. He appears well-developed and well-nourished.  HENT:  Head: Normocephalic and atraumatic.  Right Ear: Tympanic membrane, external ear and ear canal normal.  Left Ear: Tympanic membrane, external ear and ear canal normal.  Nose: Mucosal edema present. Right sinus exhibits maxillary sinus tenderness.  Mouth/Throat: No oropharyngeal exudate or posterior oropharyngeal edema.  Eyes: No scleral icterus.  Neck: Neck supple.  Cardiovascular: Regular rhythm and normal heart sounds.   Pulmonary/Chest: Effort normal and breath sounds normal. No respiratory distress.  Neurological: He is alert and oriented to person, place, and time.  Skin: Skin is warm and dry.  Psychiatric: He has a normal mood and affect. His speech is normal.    ED Course  Procedures (including critical care time) Labs Review Labs Reviewed - No data to display  Imaging Review No results found.   MDM   1. Acute maxillary sinusitis    1)  Take the prescribed antibiotic as instructed. 2)  Use nasal saline solution (over the counter) at least 3 times a day. 3)  Use over the counter decongestants like Zyrtec-D every 12 hours as needed to help with  congestion.  If you have hypertension, do not take medicines with sudafed.  4)  Can take tylenol every 6 hours or motrin every 8 hours for pain or fever. 5)  Follow up with your primary doctor if no improvement in 5-7 days, sooner if increasing pain, fever, or new symptoms.     Marlaine Hind, MD 01/17/14 1644  Marlaine Hind, MD 01/17/14 [phone]

## 2014-07-02 ENCOUNTER — Emergency Department
Admission: EM | Admit: 2014-07-02 | Discharge: 2014-07-02 | Disposition: A | Discharge status: Home / Self Care | Payer: BC Managed Care – PPO | Source: Home / Self Care | Attending: Emergency Medicine | Admitting: Emergency Medicine

## 2014-07-02 ENCOUNTER — Encounter: Payer: Self-pay | Admitting: *Deleted

## 2014-07-02 DIAGNOSIS — J209 Acute bronchitis, unspecified: Secondary | ICD-10-CM

## 2014-07-02 MED ORDER — AZITHROMYCIN 250 MG PO TABS: ORAL_TABLET | ORAL | Status: DC

## 2014-07-02 MED ORDER — PROMETHAZINE-CODEINE 6.25-10 MG/5ML PO SYRP: ORAL_SOLUTION | ORAL | Status: DC

## 2014-07-02 NOTE — ED Provider Notes (Signed)
CSN: [id number]     Arrival date & time 07/02/14  1020 History   First MD Initiated Contact with Patient 07/02/14 1038     Chief Complaint  Patient presents with  . Cough  . Nasal Congestion  . Otalgia   (Consider location/radiation/quality/duration/timing/severity/associated sxs/prior Treatment) HPI Shawn Lloyd c/o HA, cough (dry) congestion and right ear pain x 3 days. Taken Mucinex OTC. Rec'd flu vac this season  History of present illness:  Several days of worsening congestion and cough, associated with discolored sputum and fever. Has tried over-the-counter treatments without significant improvement.  Review of systems: + scratchy sore throat. +hoarseness   No shortness of breath. No chest pain. No abdominal pain. No nausea or vomiting. No diarrhea. No GU symptoms. No new rash. No eye discharge. No syncope or focal neurologic symptoms.  Past Medical History  Diagnosis Date  . Anxiety   . Depression   . Hyperlipidemia   . Mood disorder   . Hyperlipemia    Past Surgical History  Procedure Laterality Date  . Left shoulder surgery    . Shoulder surgery     Family History  Problem Relation Age of Onset  . Bipolar disorder Mother   . Heart failure Mother   . Hypertension Mother   . Cancer Father     throat, prostate   History  Substance Use Topics  . Smoking status: Never Smoker   . Smokeless tobacco: Never Used  . Alcohol Use: No    Review of Systems  All other systems reviewed and are negative.   Allergies  Review of patient's allergies indicates no known allergies.  Home Medications   Prior to Admission medications   Medication Sig Start Date End Date Taking? Authorizing Provider  azithromycin (ZITHROMAX Z-PAK) 250 MG tablet Take 2 tablets on day one, then 1 tablet daily on days 2 through 5 07/02/14   Lajean Manesavid Massey, MD  promethazine-codeine [facility](PHENERGAN WITH CODEINE) 6.25-10 MG/5ML syrup Take 1-2 teaspoons every 4-6 hours as needed for cough. May cause  drowsiness. 07/02/14   Lajean Manesavid Massey, MD  sertraline (ZOLOFT) 100 MG tablet TAKE 1 TABLET EVERY DAY 11/30/13   Larena SoxShaji J Puthuvel, MD   BP 134/83 mmHg  Pulse 73  Temp(Src) 98.6 F (37 C) (Oral)  Resp 14  Wt 221 lb (100.245 kg)  SpO2 95% Physical Exam  Constitutional: He is oriented to person, place, and time. He appears well-developed and well-nourished. No distress.  HENT:  Head: Normocephalic and atraumatic.  Right Ear: Tympanic membrane normal.  Left Ear: Tympanic membrane normal.  Nose: Mucosal edema and rhinorrhea present. Right sinus exhibits no maxillary sinus tenderness and no frontal sinus tenderness. Left sinus exhibits no maxillary sinus tenderness and no frontal sinus tenderness.  Mouth/Throat: Oropharynx is clear and moist. No oropharyngeal exudate.  Eyes: Right eye exhibits no discharge. Left eye exhibits no discharge. No scleral icterus.  Neck: Neck supple.  Cardiovascular: Normal rate, regular rhythm and normal heart sounds.   Pulmonary/Chest: No respiratory distress. He has no wheezes. He has rhonchi. He has no rales.  Lymphadenopathy:    He has no cervical adenopathy.  Neurological: He is alert and oriented to person, place, and time.  Skin: Skin is warm and dry.  Nursing note and vitals reviewed.   ED Course  Procedures (including critical care time) Labs Review Labs Reviewed - No data to display  Imaging Review No results found.   MDM   1. Acute bronchitis, unspecified organism    Treatment options discussed,  as well as risks, benefits, alternatives. Patient voiced understanding and agreement with the following plans:   Discharge Medication List as of 07/02/2014 10:51 AM    START taking these medications   Details  azithromycin (ZITHROMAX Z-PAK) 250 MG tablet Take 2 tablets on day one, then 1 tablet daily on days 2 through 5, Print    promethazine-codeine (PHENERGAN WITH CODEINE) 6.25-10 MG/5ML syrup Take 1-2 teaspoons every 4-6 hours as needed for  cough. May cause drowsiness., Print       Other sx care. mucinex Follow-up with your primary care doctor in 5-7 days if not improving, or sooner if symptoms become worse. Precautions discussed. Red flags discussed. Questions invited and answered. Patient voiced understanding and agreement.   Lajean Manesavid Massey, MD 07/02/14 [phone]

## 2014-07-02 NOTE — ED Notes (Signed)
[place]ne c/o HA, cough (dry) congestion and right ear pain x 3 days. Taken Mucinex OTC. Rec'd flu vac this season.

## 2014-10-09 ENCOUNTER — Ambulatory Visit (INDEPENDENT_AMBULATORY_CARE_PROVIDER_SITE_OTHER): Payer: BLUE CROSS/BLUE SHIELD | Admitting: Physician Assistant

## 2014-10-09 ENCOUNTER — Encounter: Payer: Self-pay | Admitting: Physician Assistant

## 2014-10-09 VITALS — BP 111/62 | HR 62 | Ht 70.0 in | Wt 226.0 lb

## 2014-10-09 DIAGNOSIS — R52 Pain, unspecified: Secondary | ICD-10-CM | POA: Diagnosis not present

## 2014-10-09 DIAGNOSIS — E785 Hyperlipidemia, unspecified: Secondary | ICD-10-CM

## 2014-10-09 DIAGNOSIS — R208 Other disturbances of skin sensation: Secondary | ICD-10-CM

## 2014-10-09 DIAGNOSIS — R200 Anesthesia of skin: Secondary | ICD-10-CM

## 2014-10-09 LAB — POCT GLYCOSYLATED HEMOGLOBIN (HGB A1C): HEMOGLOBIN A1C: 5.6

## 2014-10-09 MED ORDER — GABAPENTIN 100 MG PO CAPS: ORAL_CAPSULE | ORAL | Status: DC

## 2014-10-11 LAB — COMPLETE METABOLIC PANEL WITH GFR
ALBUMIN: 4.6 g/dL (ref 3.5–5.2)
ALK PHOS: 69 U/L (ref 39–117)
ALT: 28 U/L (ref 0–53)
AST: 22 U/L (ref 0–37)
BUN: 21 mg/dL (ref 6–23)
CO2: 29 mEq/L (ref 19–32)
Calcium: 9.4 mg/dL (ref 8.4–10.5)
Chloride: 103 mEq/L (ref 96–112)
Creat: 0.9 mg/dL (ref 0.50–1.35)
GFR, Est African American: >89 mL/min
GFR, Est Non African American: >89 mL/min
Glucose, Bld: 91 mg/dL (ref 70–99)
POTASSIUM: 4.6 meq/L (ref 3.5–5.3)
SODIUM: 138 meq/L (ref 135–145)
Total Bilirubin: 0.6 mg/dL (ref 0.2–1.2)
Total Protein: 7.2 g/dL (ref 6.0–8.3)

## 2014-10-12 LAB — TSH: TSH: 1.599 u[IU]/mL (ref 0.350–4.500)

## 2014-10-12 LAB — VITAMIN B12: Vitamin B-12: 310 pg/mL (ref 211–911)

## 2014-10-12 LAB — VITAMIN D 25 HYDROXY (VIT D DEFICIENCY, FRACTURES): VIT D 25 HYDROXY: 26 ng/mL — AB (ref 30–100)

## 2014-10-15 DIAGNOSIS — R52 Pain, unspecified: Secondary | ICD-10-CM | POA: Insufficient documentation

## 2014-10-15 DIAGNOSIS — R200 Anesthesia of skin: Secondary | ICD-10-CM | POA: Insufficient documentation

## 2014-10-15 NOTE — Progress Notes (Signed)
Subjective:    Patient ID: Shawn Lloyd, male    DOB: 06-30-1961, 54 y.o.   MRN: [id number]  HPI  Patient is a 54 year old male who presents to the clinic to establish care.  .. Active Ambulatory Problems    Diagnosis Date Noted  . NAUSEA 04/20/2009  . DIARRHEA 04/20/2009  . RUQ PAIN 04/20/2009  . GAD (generalized anxiety disorder) 02/03/2012  . MDD (major depressive disorder) 02/03/2012  . Hyperlipidemia 10/09/2014   Resolved Ambulatory Problems    Diagnosis Date Noted  . No Resolved Ambulatory Problems   Past Medical History  Diagnosis Date  . Anxiety   . Depression   . Mood disorder   . Hyperlipemia    .Marland Kitchen Family History  Problem Relation Age of Onset  . Bipolar disorder Mother   . Heart failure Mother   . Hypertension Mother   . Cancer Father     throat, prostate  . Hypertension Father   . Hyperlipidemia Father    .Marland Kitchen History   Social History  . Marital Status: Married    Spouse Name: N/A  . Number of Children: N/A  . Years of Education: N/A   Occupational History  . Not on file.   Social History Main Topics  . Smoking status: Never Smoker   . Smokeless tobacco: Never Used  . Alcohol Use: Yes  . Drug Use: No  . Sexual Activity: Yes   Other Topics Concern  . Not on file   Social History Narrative   Patient presents today with bilateral legs week, burning and numbness and tingling. He was previously seen by neurologist Dr. Rise Paganini. Per patient he had an extensive workup with numerous tests including MRI, EMG and ABIs. Everything is essentially been negative. He was sent to a vascular surgeon at one point but they did nothing. The symptoms have been going on for 3-4 years but progressively get worse. He is having more and more trouble with pain and symptoms at night. 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 He cannot get back in with his neurologist until June and really wanted to to have some relief.   Review of Systems  All  other systems reviewed and are negative.      Objective:   Physical Exam  Constitutional: He is oriented to person, place, and time. He appears well-developed and well-nourished.  HENT:  Head: Normocephalic and atraumatic.  Cardiovascular: Normal rate, regular rhythm and normal heart sounds.   Pulmonary/Chest: Effort normal and breath sounds normal.  Neurological: He is alert and oriented to person, place, and time.  Skin: Skin is dry.  Psychiatric: He has a normal mood and affect. His behavior is normal.          Assessment & Plan:  Numbness and tingling in both legs/burning pain- unclear etiology today. mircofilament testing was normal today. .. Lab Results  Component Value Date   HGBA1C 5.6 10/09/2014   No diabetes.  Will get a vitamin D, thyroid, B12 and CMP to look for any other metabolic causes of this bilateral pain.  It seems as if he's had a full workup with Dr. Verlee Rossetti, neurologist. I would like for him to follow back up with her. My nurse called the office and got him a sooner appointment in March to follow-up. Per patient seems like he's had MRI, EMG, ABI and all were essentially normal. I did give him some Neurontin to start at bedtime. Discussed side effects. If  improving symptoms could start taking twice a day and we can increase from there.  Hyperlipidemia- discuss these complete physical for fasting labs. We'll get records and anterior into system. Has not been treated with medication in the past.

## 2014-11-05 ENCOUNTER — Encounter: Payer: Self-pay | Admitting: Physician Assistant

## 2014-11-18 ENCOUNTER — Other Ambulatory Visit: Payer: Self-pay | Admitting: Physician Assistant

## 2014-11-18 ENCOUNTER — Encounter: Payer: Self-pay | Admitting: Physician Assistant

## 2014-11-18 ENCOUNTER — Ambulatory Visit (HOSPITAL_BASED_OUTPATIENT_CLINIC_OR_DEPARTMENT_OTHER)
Admission: RE | Admit: 2014-11-18 | Discharge: 2014-11-18 | Disposition: A | Discharge status: Home / Self Care | Payer: BLUE CROSS/BLUE SHIELD | Source: Ambulatory Visit | Attending: Physician Assistant | Admitting: Physician Assistant

## 2014-11-18 ENCOUNTER — Ambulatory Visit (INDEPENDENT_AMBULATORY_CARE_PROVIDER_SITE_OTHER): Payer: BLUE CROSS/BLUE SHIELD | Admitting: Physician Assistant

## 2014-11-18 ENCOUNTER — Telehealth: Payer: Self-pay | Admitting: Family Medicine

## 2014-11-18 VITALS — BP 113/70 | HR 82 | Ht 70.0 in | Wt 222.0 lb

## 2014-11-18 DIAGNOSIS — X58XXXA Exposure to other specified factors, initial encounter: Secondary | ICD-10-CM | POA: Insufficient documentation

## 2014-11-18 DIAGNOSIS — L03115 Cellulitis of right lower limb: Secondary | ICD-10-CM

## 2014-11-18 DIAGNOSIS — S8011XA Contusion of right lower leg, initial encounter: Secondary | ICD-10-CM | POA: Insufficient documentation

## 2014-11-18 DIAGNOSIS — M79604 Pain in right leg: Secondary | ICD-10-CM | POA: Insufficient documentation

## 2014-11-18 DIAGNOSIS — S8991XA Unspecified injury of right lower leg, initial encounter: Secondary | ICD-10-CM

## 2014-11-18 MED ORDER — CEPHALEXIN 500 MG PO CAPS: 500.0000 mg | ORAL_CAPSULE | Freq: Two times a day (BID) | ORAL | Status: DC

## 2014-11-18 NOTE — Telephone Encounter (Signed)
Notified by our after hours service that US results were available, reviewed radiologist impression that there was no evidence of DVT.  Called patient's mobile number with results, informed him of results and [facility].

## 2014-11-18 NOTE — Progress Notes (Signed)
   Subjective:    Patient ID: Shawn Lloyd, male    DOB: 1961-08-07, 54 y.o.   MRN: [id number]  HPI Pt is a 54 yo male who presents to the clinic with 1 and 1/2 weeks since metal pipe hit the medial lower calf. Some minimal pain initial but symptoms did not seem to worsen until flew to [place]. Now his right calf and into ankle are swelling and pain seems to be increasing. Rates 5/10 today. Worse with walking. Does admit to becoming more red and feeling warm. No SOB, CP, wheezing.    Review of Systems  All other systems reviewed and are negative.      Objective:   Physical Exam  Constitutional: He appears well-developed and well-nourished.  HENT:  Head: Normocephalic and atraumatic.  Cardiovascular: Normal rate, regular rhythm and normal heart sounds.   Pulmonary/Chest: Effort normal and breath sounds normal. He has no wheezes.  Musculoskeletal:  Right lower extremity:  Warm to touch anterior with some erythema noted.  Small scabbed over abrasion on the medial left anterior leg.  brusing noted more posteriorly.  Mild edema noted up into middle of calf.  ROM of right foot normal and without pain.           Assessment & Plan:  Right leg injury/swelling/cellulitis- pt has hx of trauma and flying. Will rule out DVT. No signs of PE. Will treat for a cellulitis with keflex. There does appear to be a place where abrasion occurred. Ice and elevate tonight. Will follow up with results.

## 2014-11-29 ENCOUNTER — Telehealth: Payer: Self-pay | Admitting: *Deleted

## 2014-11-29 ENCOUNTER — Other Ambulatory Visit: Payer: Self-pay | Admitting: Physician Assistant

## 2014-11-29 MED ORDER — GABAPENTIN 400 MG PO CAPS: 400.0000 mg | ORAL_CAPSULE | Freq: Three times a day (TID) | ORAL | Status: DC

## 2014-11-29 NOTE — Telephone Encounter (Signed)
Called patient & informed Sent 400mg  three times a day of gabapentin for patient to pick up

## 2014-11-29 NOTE — Telephone Encounter (Signed)
Sent 400mg  three times a day of gabapentin for patient to pick up.

## 2014-11-29 NOTE — Telephone Encounter (Signed)
Patient called requesting a  refill on neurontin 100 mg. Patient said he is currently taking 4 of the 100 mg 3 x daily. Patient also says he neurologist told him that it's been heard of people taking up to  1200 mg a day to get pain under control.

## 2015-06-12 ENCOUNTER — Other Ambulatory Visit: Payer: Self-pay | Admitting: Physician Assistant

## 2015-11-12 ENCOUNTER — Ambulatory Visit (INDEPENDENT_AMBULATORY_CARE_PROVIDER_SITE_OTHER): Payer: BLUE CROSS/BLUE SHIELD | Admitting: Physician Assistant

## 2015-11-12 ENCOUNTER — Encounter: Payer: Self-pay | Admitting: Physician Assistant

## 2015-11-12 VITALS — BP 121/78 | HR 72 | Temp 98.6°F | Ht 70.0 in

## 2015-11-12 DIAGNOSIS — J111 Influenza due to unidentified influenza virus with other respiratory manifestations: Secondary | ICD-10-CM | POA: Diagnosis not present

## 2015-11-12 DIAGNOSIS — R52 Pain, unspecified: Secondary | ICD-10-CM

## 2015-11-12 DIAGNOSIS — Z20828 Contact with and (suspected) exposure to other viral communicable diseases: Secondary | ICD-10-CM

## 2015-11-12 DIAGNOSIS — R69 Illness, unspecified: Principal | ICD-10-CM

## 2015-11-12 LAB — POCT INFLUENZA A/B
INFLUENZA A, POC: NEGATIVE
INFLUENZA B, POC: NEGATIVE

## 2015-11-12 MED ORDER — OSELTAMIVIR PHOSPHATE 75 MG PO CAPS: 75.0000 mg | ORAL_CAPSULE | Freq: Two times a day (BID) | ORAL | Status: DC

## 2015-11-12 NOTE — Patient Instructions (Signed)

## 2015-11-12 NOTE — Progress Notes (Signed)
   Subjective:    Patient ID: Shawn AlbaJames B Lloyd, male    DOB: May 03, 1961, 55 y.o.   MRN: [id number]  HPI  Pt is a 55 yo male who presents to the clinic with body aches, joints hurting, neck stiff since yesterday. His co-worker was tested for flu and positive this am. He wants to be tested. No fever. Has taken ibuprofen and does help some. No sinus pressure, cough, ST, ear pain, SOB or wheezing.     Review of Systems  All other systems reviewed and are negative.      Objective:   Physical Exam  Constitutional: He appears well-developed and well-nourished.  HENT:  Head: Normocephalic and atraumatic.  Right Ear: External ear normal.  Left Ear: External ear normal.  Mouth/Throat: No oropharyngeal exudate.  Oropharynx erythematous with any tonsil swelling or exudate.  Bilateral nares red and swollen.  Eyes: Conjunctivae are normal. Right eye exhibits no discharge. Left eye exhibits no discharge.  Neck: Normal range of motion. Neck supple.  Cardiovascular: Normal rate, regular rhythm and normal heart sounds.   Pulmonary/Chest: Effort normal and breath sounds normal. He has no wheezes.  Lymphadenopathy:    He has no cervical adenopathy.  Skin: Skin is dry. He is not diaphoretic.  Psychiatric: He has a normal mood and affect. His behavior is normal.          Assessment & Plan:  Influenza like illness- no fever. Rapid flu is negative. Discussed body aches and fatigue consistent with flu like illness. If develops a fever, body aches, chills, extreme fatigue in next 24 hours printed tamiflu to take. Stay hydrated and rest.

## 2016-03-31 ENCOUNTER — Ambulatory Visit (INDEPENDENT_AMBULATORY_CARE_PROVIDER_SITE_OTHER): Payer: 59 | Admitting: Physician Assistant

## 2016-03-31 ENCOUNTER — Encounter: Payer: Self-pay | Admitting: Physician Assistant

## 2016-03-31 VITALS — BP 129/85 | HR 100 | Ht 70.0 in | Wt 218.0 lb

## 2016-03-31 DIAGNOSIS — R21 Rash and other nonspecific skin eruption: Secondary | ICD-10-CM | POA: Diagnosis not present

## 2016-03-31 MED ORDER — METHYLPREDNISOLONE SODIUM SUCC 125 MG IJ SOLR
125.0000 mg | Freq: Once | INTRAMUSCULAR | Status: AC
  Administered 2016-03-31: 125 mg via INTRAMUSCULAR

## 2016-03-31 MED ORDER — TRIAMCINOLONE ACETONIDE 0.1 % EX CREA: 1.0000 "application " | TOPICAL_CREAM | Freq: Two times a day (BID) | CUTANEOUS | 0 refills | Status: DC

## 2016-03-31 NOTE — Progress Notes (Signed)
   Subjective:    Patient ID: Shawn Lloyd, male    DOB: [birth year], 55 y.o.   MRN: [id number]  HPI  Pt is a 55 yo male who presents to the clinic with a rash. He first noticed 2 bumps last Thursday that were itchy. Since the bumps have spread only on his abdomen. No pain. Seem to be solid papules and then after scratching scab over. Not tried anything to make better. Concerned about being chicken pox since per patient he has had 2 times. No fever, chills, n/v/d. Pt is a IT sales professionalfirefighter. Noone else has rash like this.    Review of Systems See HPI.     Objective:   Physical Exam  Skin:             Assessment & Plan:  Rash- unknown etiology. Although pt has had varicella twice I did not see any vesicles and don't think rash looks herpetic. Looks like bug bites. Shot of solumedrol 125mg  IM and triamcinolone bid given to use for next few days. Follow up with new symptoms or worsening rash.

## 2016-04-05 ENCOUNTER — Encounter: Payer: Self-pay | Admitting: Family Medicine

## 2016-04-05 ENCOUNTER — Ambulatory Visit (INDEPENDENT_AMBULATORY_CARE_PROVIDER_SITE_OTHER): Payer: 59 | Admitting: Family Medicine

## 2016-04-05 VITALS — BP 133/87 | HR 79 | Temp 97.8°F | Wt 216.0 lb

## 2016-04-05 DIAGNOSIS — L739 Follicular disorder, unspecified: Secondary | ICD-10-CM

## 2016-04-05 MED ORDER — DOXYCYCLINE HYCLATE 100 MG PO TABS: 100.0000 mg | ORAL_TABLET | Freq: Two times a day (BID) | ORAL | 0 refills | Status: DC

## 2016-04-05 MED ORDER — MUPIROCIN CALCIUM 2 % NA OINT: 1.0000 "application " | TOPICAL_OINTMENT | Freq: Two times a day (BID) | NASAL | 3 refills | Status: DC

## 2016-04-05 NOTE — Progress Notes (Signed)
       Shawn Lloyd is a 55 y.o. male who [facility] today for evaluation of a rash.  His rash began 12 days ago with two small erythematous papules on his abdomen.   The rash is itchy and painful, but there is no discharge or pus.  Several days later several more popped up over the left side of his abdomen and he was seen here.  He was given Solu Medrol and triamcinolone cream.  Patient reports an improvement until last night when he noticed more papules spread across his abdomen to his back and up to his chest.  He denies known bug bites and new exposures.  Denies fever, chills, myalgias, arthralgias, nausea, vomiting, and abdominal pain.  No other complaints.     Past Medical History:  Diagnosis Date  . Anxiety   . Depression   . Hyperlipemia   . Hyperlipidemia   . Mood disorder [facility](HCC)    Past Surgical History:  Procedure Laterality Date  . left shoulder surgery    . SHOULDER SURGERY     Social History  Substance Use Topics  . Smoking status: Never Smoker  . Smokeless tobacco: Never Used  . Alcohol use Yes   family history includes Bipolar disorder in his mother; Cancer in his father; Heart failure in his mother; Hyperlipidemia in his father; Hypertension in his father and mother.  ROS as above: No headache, visual changes, nausea, vomiting, diarrhea, constipation, dizziness, abdominal pain, fevers, chills, night sweats, weight loss, swollen lymph nodes, body aches, joint swelling, muscle aches, chest pain, shortness of breath, mood changes, visual or auditory hallucinations.   Medications: Current Outpatient Prescriptions  Medication Sig Dispense Refill  . nortriptyline (PAMELOR) 25 MG capsule Take 25 mg by mouth at bedtime.    . sertraline (ZOLOFT) 100 MG tablet TAKE 1 TABLET EVERY DAY 30 tablet 2  . triamcinolone cream (KENALOG) 0.1 % Apply 1  application topically 2 (two) times daily. 80 g 0  . doxycycline (VIBRA-TABS) 100 MG tablet Take 1 tablet (100 mg total) by mouth 2 (two) times daily. 14 tablet 0  . mupirocin nasal ointment (BACTROBAN) 2 % Place 1 application into the nose 2 (two) times daily. Apply to both nares BID for 5 days. 30 g 3   No current facility-administered medications for this visit.    No Known Allergies   Exam:  BP 133/87   Pulse 79   Temp 97.8 F (36.6 C) (Oral)   Wt 216 lb (98 kg)   BMI 30.99 kg/m  Gen: Well NAD, nontoxic Abd: NABS, Soft. Nondistended, Nontender Exts: Brisk capillary refill, warm and well perfused.  Skin: scattered erythematous papules and pustules across the abdomen, lower back, and one on the chest.  Several areas of excoriation.  No drainage.  Rash crosses the midline.  No results found for this or any previous visit (from the past 24 hour(s)). No results found.    Assessment and Plan: 55 y.o. male with scattered erythematous papules and pustules likely folliculitis.  No vesicles or dermatomal pattern warranting concern for shingles.  Patient is afebrile and nontoxic appearing.  - Doxycycline 100 mg BID - Mupirocin nasal ointment - OTC Chlorhexidine body wash   No orders of the defined types were placed in this encounter.   Discussed warning signs or symptoms. Please see discharge instructions. Patient expresses understanding.

## 2016-04-05 NOTE — Patient Instructions (Signed)
Thank you for coming in today. Oral antibiotics and use nasal ointment. Use chlorhexidine body wash. Return if not better.    Folliculitis Folliculitis is redness, soreness, and swelling (inflammation) of the hair follicles. This condition can occur anywhere on the body. People with weakened immune systems, diabetes, or obesity have a greater risk of getting folliculitis. CAUSES  Bacterial infection. This is the most common cause.  Fungal infection.  Viral infection.  Contact with certain chemicals, especially oils and tars. Long-term folliculitis can result from bacteria that live in the nostrils. The bacteria may trigger multiple outbreaks of folliculitis over time. SYMPTOMS Folliculitis most commonly occurs on the scalp, thighs, legs, back, buttocks, and areas where hair is shaved frequently. An early sign of folliculitis is a small, white or yellow, pus-filled, itchy lesion (pustule). These lesions appear on a red, inflamed follicle. They are usually less than 0.2 inches (5 mm) wide. When there is an infection of the follicle that goes deeper, it becomes a boil or furuncle. A group of closely packed boils creates a larger lesion (carbuncle). Carbuncles tend to occur in hairy, sweaty areas of the body. DIAGNOSIS  Your caregiver can usually tell what is wrong by doing a physical exam. A sample may be taken from one of the lesions and tested in a lab. This can help determine what is causing your folliculitis. TREATMENT  Treatment may include:  Applying warm compresses to the affected areas.  Taking antibiotic medicines orally or applying them to the skin.  Draining the lesions if they contain a large amount of pus or fluid.  Laser hair removal for cases of long-lasting folliculitis. This helps to prevent regrowth of the hair. HOME CARE INSTRUCTIONS  Apply warm compresses to the affected areas as directed by your caregiver.  If antibiotics are prescribed, take them as directed.  Finish them even if you start to feel better.  You may take over-the-counter medicines to relieve itching.  Do not shave irritated skin.  Follow up with your caregiver as directed. SEEK IMMEDIATE MEDICAL CARE IF:   You have increasing redness, swelling, or pain in the affected area.  You have a fever. MAKE SURE YOU:  Understand these instructions.  Will watch your condition.  Will get help right away if you are not doing well or get worse.   This information is not intended to replace advice given to you by your health care provider. Make sure you discuss any questions you have with your health care provider.   Document Released: 10/18/2001 Document Revised: 08/30/2014 Document Reviewed: 11/09/2011 Elsevier Interactive Patient Education [employer]2016 Elsevier Inc.

## 2016-06-22 ENCOUNTER — Ambulatory Visit (INDEPENDENT_AMBULATORY_CARE_PROVIDER_SITE_OTHER): Payer: 59 | Admitting: Family Medicine

## 2016-06-22 VITALS — BP 140/81 | HR 85 | Temp 98.2°F | Wt 228.0 lb

## 2016-06-22 DIAGNOSIS — S60417A Abrasion of left little finger, initial encounter: Secondary | ICD-10-CM

## 2016-06-22 DIAGNOSIS — L089 Local infection of the skin and subcutaneous tissue, unspecified: Secondary | ICD-10-CM | POA: Diagnosis not present

## 2016-06-22 MED ORDER — DOXYCYCLINE HYCLATE 100 MG PO TABS: 100.0000 mg | ORAL_TABLET | Freq: Two times a day (BID) | ORAL | 0 refills | Status: DC

## 2016-06-22 MED ORDER — MUPIROCIN 2 % EX OINT: TOPICAL_OINTMENT | CUTANEOUS | 3 refills | Status: DC

## 2016-06-22 NOTE — Progress Notes (Signed)
Shawn Lloyd is a 55 y.o. male who [facility] today for skin infection. Patient suffered a abrasion/laceration to the dorsal aspect of his left third digit at the dorsal PIP. This happened a few days ago. Since then the   skin overlying the dorsal aspect of the joint has become red swollen and puffy. It is mildly tender. He can sometimes expresses a small amount of pus. He denies any pain with finger extension. No fevers or chills.  Past Medical History:  Diagnosis Date  . Anxiety   . Depression   . Hyperlipemia   . Hyperlipidemia   . Mood disorder [facility](HCC)    Past Surgical History:  Procedure Laterality Date  . left shoulder surgery    . SHOULDER SURGERY     Social History  Substance Use Topics  . Smoking status: Never Smoker  . Smokeless tobacco: Never Used  . Alcohol use Yes   family history includes Bipolar disorder in his mother; Cancer in his father; Heart failure in his mother; Hyperlipidemia in his father; Hypertension in his father and mother.  ROS as above:  Medications: Current Outpatient Prescriptions  Medication Sig Dispense Refill  . nortriptyline (PAMELOR) 25 MG capsule Take 25 mg by mouth at bedtime.    . sertraline (ZOLOFT) 100 MG tablet TAKE 1 TABLET EVERY DAY 30 tablet 2  . doxycycline (VIBRA-TABS) 100 MG tablet Take 1 tablet (100 mg total) by mouth 2 (two) times daily. 14 tablet 0  . mupirocin ointment (BACTROBAN) 2 % Apply to affected area TID for 7 days. 30 g 3   No current facility-administered medications for this visit.    No Known Allergies  Health Maintenance Health Maintenance  Topic Date Due  . Hepatitis C Screening  [birth year]  . HIV Screening  01/02/1976  . TETANUS/TDAP  01/02/1980  . COLONOSCOPY  01/02/2011  . INFLUENZA VACCINE  03/23/2016     Exam:  BP 140/81   Pulse 85   Temp 98.2 F (36.8 C)  (Oral)   Wt 228 lb (103.4 kg)   BMI 32.71 kg/m  Gen: Well NAD Left hand: Swelling dorsal PIP third digit. Limited flexion due to skin tightness. Intact extension without pain. No pus is expressible. No extending erythema. The extensor tendons proximally and distally are nontender.  Abscess incision and drainage. Consent obtained and timeout performed. Skin cleaned with alcohol, and cold spray applied. 3 mL of lidocaine without epi injected in a digital block achieving good anesthesia. Skin was again cleaned with alcohol. A sharp incision was made to the area of fluctuance. Care was taken to keep the incision very superficial to avoid the extensor tendons Pus was expressed and cultured. Patient tolerated the procedure well. A dressing was applied    No results found for this or any previous visit (from the past 72 hour(s)). No results found.    Assessment and Plan: 55 y.o. male with superficial skin infection overlying the PIP third digit. I do not think the infection involves any of the extensor tendons are joint and it appears to be localized. Culture pending. Treat empirically with doxycycline and mupirocin ointment. Return if not improving. Patient notes his tetanus vaccine was given of years ago and is up-to-date.   Orders Placed This Encounter  Procedures  . Wound culture    Order Specific Question:   Source    Answer:   Left 2nd PIP skin infection. Suspect MRSA  Discussed warning signs or symptoms. Please see discharge instructions. Patient expresses understanding.

## 2016-06-22 NOTE — Patient Instructions (Signed)
Thank you for coming in today. Keep the wound clean.  Take doxycycline antibiotics.  Return for recheck if not getting better.  Use the ointment with dressing changes daily or twice daily.    Abscess An abscess is an infected area that contains a collection of pus and debris.It can occur in almost any part of the body. An abscess is also known as a furuncle or boil. CAUSES  An abscess occurs when tissue gets infected. This can occur from blockage of oil or sweat glands, infection of hair follicles, or a minor injury to the skin. As the body tries to fight the infection, pus collects in the area and creates pressure under the skin. This pressure causes pain. People with weakened immune systems have difficulty fighting infections and get certain abscesses more often.  SYMPTOMS Usually an abscess develops on the skin and becomes a painful mass that is red, warm, and tender. If the abscess forms under the skin, you may feel a moveable soft area under the skin. Some abscesses break open (rupture) on their own, but most will continue to get worse without care. The infection can spread deeper into the body and eventually into the bloodstream, causing you to feel ill.  DIAGNOSIS  Your caregiver will take your medical history and perform a physical exam. A sample of fluid may also be taken from the abscess to determine what is causing your infection. TREATMENT  Your caregiver may prescribe antibiotic medicines to fight the infection. However, taking antibiotics alone usually does not cure an abscess. Your caregiver may need to make a small cut (incision) in the abscess to drain the pus. In some cases, gauze is packed into the abscess to reduce pain and to continue draining the area. HOME CARE INSTRUCTIONS   Only take over-the-counter or prescription medicines for pain, discomfort, or fever as directed by your caregiver.  If you were prescribed antibiotics, take them as directed. Finish them even if you  start to feel better.  If gauze is used, follow your caregiver's directions for changing the gauze.  To avoid spreading the infection:  Keep your draining abscess covered with a bandage.  Wash your hands well.  Do not share personal care items, towels, or whirlpools with others.  Avoid skin contact with others.  Keep your skin and clothes clean around the abscess.  Keep all follow-up appointments as directed by your caregiver. SEEK MEDICAL CARE IF:   You have increased pain, swelling, redness, fluid drainage, or bleeding.  You have muscle aches, chills, or a general ill feeling.  You have a fever. MAKE SURE YOU:   Understand these instructions.  Will watch your condition.  Will get help right away if you are not doing well or get worse.   This information is not intended to replace advice given to you by your health care provider. Make sure you discuss any questions you have with your health care provider.   Document Released: 05/19/2005 Document Revised: 02/08/2012 Document Reviewed: 10/22/2011 Elsevier Interactive Patient Education [employer]2016 Elsevier Inc.

## 2016-06-26 LAB — WOUND CULTURE: GRAM STAIN: NONE SEEN

## 2016-07-19 ENCOUNTER — Ambulatory Visit (INDEPENDENT_AMBULATORY_CARE_PROVIDER_SITE_OTHER): Payer: 59 | Admitting: Physician Assistant

## 2016-07-19 ENCOUNTER — Encounter: Payer: Self-pay | Admitting: Physician Assistant

## 2016-07-19 VITALS — BP 148/80 | HR 67 | Ht 70.0 in | Wt 226.0 lb

## 2016-07-19 DIAGNOSIS — Z1159 Encounter for screening for other viral diseases: Secondary | ICD-10-CM

## 2016-07-19 DIAGNOSIS — Z0000 Encounter for general adult medical examination without abnormal findings: Secondary | ICD-10-CM | POA: Diagnosis not present

## 2016-07-19 DIAGNOSIS — Z13220 Encounter for screening for lipoid disorders: Secondary | ICD-10-CM | POA: Diagnosis not present

## 2016-07-19 DIAGNOSIS — R45851 Suicidal ideations: Secondary | ICD-10-CM

## 2016-07-19 DIAGNOSIS — Z114 Encounter for screening for human immunodeficiency virus [HIV]: Secondary | ICD-10-CM | POA: Diagnosis not present

## 2016-07-19 DIAGNOSIS — N400 Enlarged prostate without lower urinary tract symptoms: Secondary | ICD-10-CM

## 2016-07-19 DIAGNOSIS — Z131 Encounter for screening for diabetes mellitus: Secondary | ICD-10-CM

## 2016-07-19 LAB — COMPLETE METABOLIC PANEL WITH GFR
ALT: 41 U/L (ref 9–46)
AST: 30 U/L (ref 10–35)
Albumin: 4.7 g/dL (ref 3.6–5.1)
Alkaline Phosphatase: 59 U/L (ref 40–115)
BUN: 20 mg/dL (ref 7–25)
CALCIUM: 9.2 mg/dL (ref 8.6–10.3)
CHLORIDE: 105 mmol/L (ref 98–110)
CO2: 26 mmol/L (ref 20–31)
Creat: 0.96 mg/dL (ref 0.70–1.33)
GFR, Est African American: >89 mL/min (ref >=60)
GFR, Est Non African American: 89 mL/min (ref >=60)
GLUCOSE: 95 mg/dL (ref 65–99)
Potassium: 4.3 mmol/L (ref 3.5–5.3)
SODIUM: 140 mmol/L (ref 135–146)
Total Bilirubin: 0.6 mg/dL (ref 0.2–1.2)
Total Protein: 7.3 g/dL (ref 6.1–8.1)

## 2016-07-19 LAB — LIPID PANEL
CHOL/HDL RATIO: 5.8 ratio — AB (ref <5.0)
Cholesterol: 271 mg/dL — ABNORMAL HIGH (ref <200)
HDL: 47 mg/dL (ref >40)
LDL Cholesterol: 197 mg/dL — ABNORMAL HIGH (ref <100)
Triglycerides: 134 mg/dL (ref <150)
VLDL: 27 mg/dL (ref <30)

## 2016-07-19 MED ORDER — PREGABALIN 75 MG PO CAPS: 75.0000 mg | ORAL_CAPSULE | Freq: Two times a day (BID) | ORAL | 0 refills | Status: DC

## 2016-07-19 NOTE — Progress Notes (Signed)
Subjective:    Patient ID: Shawn Lloyd, male    DOB: 09-Mar-1961, 55 y.o.   MRN: [id number]  HPI  Pt is a [age] yo male who presents to the clinic for CPE.   He reports not feeling right for last 2 months. Feels tired and achy all over. At times he feels like hard to take a deep breath. No cough or SOB. No chest pain. He is having weird thoughts of taking his life. He mentions that he also start taking lyrica about 2 months ago. Dr. Virgel ManifoldSemble started him on medication.    .. Active Ambulatory Problems    Diagnosis Date Noted  . NAUSEA 04/20/2009  . DIARRHEA 04/20/2009  . RUQ PAIN 04/20/2009  . GAD (generalized anxiety disorder) 02/03/2012  . MDD (major depressive disorder) 02/03/2012  . Hyperlipidemia 10/09/2014  . Burning pain 10/15/2014  . Numbness in both legs 10/15/2014  . Right leg pain 11/18/2014  . BPH without urinary obstruction 07/19/2016  . Enlarged prostate 07/19/2016  . Suicidal ideation 07/19/2016   Resolved Ambulatory Problems    Diagnosis Date Noted  . Cellulitis of right lower extremity 11/18/2014   Past Medical History:  Diagnosis Date  . Anxiety   . Depression   . Hyperlipemia   . Hyperlipidemia   . Mood disorder (HCC)    .Marland Kitchen. Family History  Problem Relation Age of Onset  . Bipolar disorder Mother   . Heart failure Mother   . Hypertension Mother   . Cancer Father     throat, prostate  . Hypertension Father   . Hyperlipidemia Father    .Marland Kitchen. Social History   Social History  . Marital status: Married    Spouse name: N/A  . Number of children: N/A  . Years of education: N/A   Occupational History  . Not on file.   Social History Main Topics  . Smoking status: Never Smoker  . Smokeless tobacco: Never Used  . Alcohol use Yes  . Drug use: No  . Sexual activity: Yes   Other Topics Concern  . Not on file   Social History Narrative  . No narrative on file       Review of Systems  All other systems reviewed and are negative. see HPI.       Objective:   Physical Exam BP (!) 148/80   Pulse 67   Ht 5\' 10"  (1.778 m)   Wt 226 lb (102.5 kg)   BMI 32.43 kg/m   General Appearance:    Alert, cooperative, no distress, appears stated age  Head:    Normocephalic, without obvious abnormality, atraumatic  Eyes:    PERRL, conjunctiva/corneas clear, EOM's intact, fundi    benign, both eyes       Ears:    Normal TM's and external ear canals, both ears  Nose:   Nares normal, septum midline, mucosa normal, no drainage    or sinus tenderness  Throat:   Lips, mucosa, and tongue normal; teeth and gums normal  Neck:   Supple, symmetrical, trachea midline, no adenopathy;       thyroid:  No enlargement/tenderness/nodules; no carotid   bruit or JVD  Back:     Symmetric, no curvature, ROM normal, no CVA tenderness  Lungs:     Clear to auscultation bilaterally, respirations unlabored  Chest wall:    No tenderness or deformity  Heart:    Regular rate and rhythm, S1 and S2 normal, no murmur, rub   or gallop  Abdomen:     Soft, non-tender, bowel sounds active all four quadrants,    no masses, no organomegaly  Genitalia:    Not done.   Rectal:    Normal tone, enlarged prostate, no masses or tenderness;   guaiac negative stool  Extremities:   Extremities normal, atraumatic, no cyanosis or edema  Pulses:   4+ and symmetric all extremities  Skin:   Skin color, texture, turgor normal, no rashes or lesions  Lymph nodes:   Cervical, supraclavicular, and axillary nodes normal  Neurologic:   CNII-XII intact. Normal strength, sensation and reflexes      throughout         Assessment & Plan:  .Marland Kitchen.Shawn Lloyd was seen today for annual exam.  Diagnoses and all orders for this visit:  Routine physical examination -     PSA  Need for hepatitis C screening test -     Hepatitis C Antibody  Encounter for screening for HIV -     HIV antibody (with reflex)  Screening for lipid disorders -     Lipid panel  Screening for diabetes mellitus -      COMPLETE METABOLIC PANEL WITH GFR  Enlarged prostate -     PSA  BPH without urinary obstruction  Suicidal ideation  Other orders -     pregabalin (LYRICA) 75 MG capsule; Take 1 capsule (75 mg total) by mouth 2 (two) times daily.   I feel like symptoms of suicidal thoughts, weakness, tired, chest tightness could be due to lyrica. Started taper off at 75mg  daily for 1 week then every other day for 1 week then stop. Let's see if side effects resolve. Follow up with Dr. Virgel ManifoldSemble, rheumatology.   EKG- NSR at 63. No arrhthymias. No ST elevation or depression. Inverted t waves V1. Reassured patient.   Follow up after stopping lyrica and recheck BP as well.   AUA is 12 and with enlarged prostate. PSA ordered. Discussed medication to help with symptoms pt declined today.   Will call to get colonoscopy results.

## 2016-07-19 NOTE — Patient Instructions (Signed)
Pregabalin capsules What is this medicine? PREGABALIN (pre GAB a lin) is used to treat nerve pain from diabetes, shingles, spinal cord injury, and fibromyalgia. It is also used to control seizures in epilepsy. This medicine may be used for other purposes; ask your health care provider or pharmacist if you have questions. COMMON BRAND NAME(S): Lyrica What should I tell my health care provider before I take this medicine? They need to know if you have any of these conditions: -bleeding problems -heart disease, including heart failure -history of alcohol or drug abuse -kidney disease -suicidal thoughts, plans, or attempt; a previous suicide attempt by you or a family member -an unusual or allergic reaction to pregabalin, gabapentin, other medicines, foods, dyes, or preservatives -pregnant or trying to get pregnant or trying to conceive with your partner -breast-feeding How should I use this medicine? Take this medicine by mouth with a glass of water. Follow the directions on the prescription label. You can take this medicine with or without food. Take your doses at regular intervals. Do not take your medicine more often than directed. Do not stop taking except on your doctor's advice. A special MedGuide will be given to you by the pharmacist with each prescription and refill. Be sure to read this information carefully each time. Talk to your pediatrician regarding the use of this medicine in children. Special care may be needed. Overdosage: If you think you have taken too much of this medicine contact a poison control center or emergency room at once. NOTE: This medicine is only for you. Do not share this medicine with others. What if I miss a dose? If you miss a dose, take it as soon as you can. If it is almost time for your next dose, take only that dose. Do not take double or extra doses. What may interact with this medicine? -alcohol -certain medicines for blood pressure like captopril,  enalapril, or lisinopril -certain medicines for diabetes, like pioglitazone or rosiglitazone -certain medicines for anxiety or sleep -narcotic medicines for pain This list may not describe all possible interactions. Give your health care provider a list of all the medicines, herbs, non-prescription drugs, or dietary supplements you use. Also tell them if you smoke, drink alcohol, or use illegal drugs. Some items may interact with your medicine. What should I watch for while using this medicine? Tell your doctor or healthcare professional if your symptoms do not start to get better or if they get worse. Visit your doctor or health care professional for regular checks on your progress. Do not stop taking except on your doctor's advice. You may develop a severe reaction. Your doctor will tell you how much medicine to take. Wear a medical identification bracelet or chain if you are taking this medicine for seizures, and carry a card that describes your disease and details of your medicine and dosage times. You may get drowsy or dizzy. Do not drive, use machinery, or do anything that needs mental alertness until you know how this medicine affects you. Do not stand or sit up quickly, especially if you are an older patient. This reduces the risk of dizzy or fainting spells. Alcohol may interfere with the effect of this medicine. Avoid alcoholic drinks. If you have a heart condition, like congestive heart failure, and notice that you are retaining water and have swelling in your hands or feet, contact your health care provider immediately. The use of this medicine may increase the chance of suicidal thoughts or actions. Pay special attention   to how you are responding while on this medicine. Any worsening of mood, or thoughts of suicide or dying should be reported to your health care professional right away. This medicine has caused reduced sperm counts in some men. This may interfere with the ability to father a  child. You should talk to your doctor or health care professional if you are concerned about your fertility. Women who become pregnant while using this medicine for seizures may enroll in the [place] Antiepileptic Drug Pregnancy Registry by calling 1-888-233-2334. This registry collects information about the safety of antiepileptic drug use during pregnancy. What side effects may I notice from receiving this medicine? Side effects that you should report to your doctor or health care professional as soon as possible: -allergic reactions like skin rash, itching or hives, swelling of the face, lips, or tongue -breathing problems -changes in vision -chest pain -confusion -jerking or unusual movements of any part of your body -loss of memory -muscle pain, tenderness, or weakness -suicidal thoughts or other mood changes -swelling of the ankles, feet, hands -unusual bruising or bleeding Side effects that usually do not require medical attention (report to your doctor or health care professional if they continue or are bothersome): -dizziness -drowsiness -dry mouth -headache -nausea -tremors -trouble sleeping -weight gain This list may not describe all possible side effects. Call your doctor for medical advice about side effects. You may report side effects to FDA at 1-800-FDA-1088. Where should I keep my medicine? Keep out of the reach of children. This medicine can be abused. Keep your medicine in a safe place to protect it from theft. Do not share this medicine with anyone. Selling or giving away this medicine is dangerous and against the law. This medicine may cause accidental overdose and death if it taken by other adults, children, or pets. Mix any unused medicine with a substance like cat litter or coffee grounds. Then throw the medicine away in a sealed container like a sealed bag or a coffee can with a lid. Do not use the medicine after the expiration date. Store at room  temperature between 15 and 30 degrees C (59 and 86 degrees F). NOTE: This sheet is a summary. It may not cover all possible information. If you have questions about this medicine, talk to your doctor, pharmacist, or health care provider.  2017 Elsevier/Gold Standard (2015-09-11 10:26:12)  

## 2016-07-20 LAB — HEPATITIS C ANTIBODY: HCV Ab: NEGATIVE

## 2016-07-20 LAB — HIV ANTIBODY (ROUTINE TESTING W REFLEX): HIV 1&2 Ab, 4th Generation: NONREACTIVE

## 2016-07-22 ENCOUNTER — Encounter: Payer: Self-pay | Admitting: Physician Assistant

## 2016-07-23 ENCOUNTER — Other Ambulatory Visit: Payer: Self-pay | Admitting: *Deleted

## 2016-07-23 MED ORDER — ATORVASTATIN CALCIUM 40 MG PO TABS: 40.0000 mg | ORAL_TABLET | Freq: Every day | ORAL | 5 refills | Status: DC

## 2016-07-23 NOTE — Progress Notes (Signed)
Ok please send lipitor 40mg  I po qd #30 with 5 refills. Will recheck labs in 6 months.

## 2016-08-30 ENCOUNTER — Ambulatory Visit (INDEPENDENT_AMBULATORY_CARE_PROVIDER_SITE_OTHER): Payer: 59 | Admitting: Physician Assistant

## 2016-08-30 VITALS — BP 129/78 | HR 69 | Wt 226.0 lb

## 2016-08-30 DIAGNOSIS — R030 Elevated blood-pressure reading, without diagnosis of hypertension: Secondary | ICD-10-CM

## 2016-08-30 NOTE — Progress Notes (Signed)
Pt came into clinic today for BP check. Pt reports at his physical his BP was elevated, Pt believes this is a side effect from the Lyrica Rx. Pt has been off Lyrica for almost a month, reports no negative side effects. Pt states he has been taking his BP at home. Reports his values are around 112/68 - 130/80. Denies any generalized headaches, dizziness, blurred vision. Will route to PCP for review. Pt advised we will contact him if PCP would like to make any changes.    Blood pressure looks great. Follow up as needed. Tandy GawJade Breeback PA-C

## 2016-09-28 ENCOUNTER — Ambulatory Visit (INDEPENDENT_AMBULATORY_CARE_PROVIDER_SITE_OTHER): Payer: 59

## 2016-09-28 ENCOUNTER — Ambulatory Visit (INDEPENDENT_AMBULATORY_CARE_PROVIDER_SITE_OTHER): Payer: 59 | Admitting: Physician Assistant

## 2016-09-28 ENCOUNTER — Encounter: Payer: Self-pay | Admitting: Physician Assistant

## 2016-09-28 VITALS — BP 116/69 | HR 76 | Ht 70.0 in | Wt 227.0 lb

## 2016-09-28 DIAGNOSIS — M48061 Spinal stenosis, lumbar region without neurogenic claudication: Secondary | ICD-10-CM | POA: Diagnosis not present

## 2016-09-28 DIAGNOSIS — M461 Sacroiliitis, not elsewhere classified: Secondary | ICD-10-CM | POA: Diagnosis not present

## 2016-09-28 DIAGNOSIS — M5441 Lumbago with sciatica, right side: Secondary | ICD-10-CM

## 2016-09-28 DIAGNOSIS — M4807 Spinal stenosis, lumbosacral region: Secondary | ICD-10-CM

## 2016-09-28 MED ORDER — HYDROCODONE-ACETAMINOPHEN 5-325 MG PO TABS: 1.0000 | ORAL_TABLET | Freq: Three times a day (TID) | ORAL | 0 refills | Status: DC | PRN

## 2016-09-28 MED ORDER — KETOROLAC TROMETHAMINE 60 MG/2ML IM SOLN
60.0000 mg | Freq: Once | INTRAMUSCULAR | Status: AC
  Administered 2016-09-28: 60 mg via INTRAMUSCULAR

## 2016-09-28 MED ORDER — PREDNISONE 10 MG (21) PO TBPK: ORAL_TABLET | ORAL | 0 refills | Status: DC

## 2016-09-28 MED ORDER — DICLOFENAC SODIUM 75 MG PO TBEC: 75.0000 mg | DELAYED_RELEASE_TABLET | Freq: Two times a day (BID) | ORAL | 1 refills | Status: DC

## 2016-09-28 NOTE — Progress Notes (Signed)
   Subjective:    Patient ID: Shawn Lloyd, male    DOB: July 18, 1961, 56 y.o.   MRN: [id number]  HPI  Pt is a 56 yo male who presents to the clinic with right low back pain and radiation of pain down the back on his leg for last month. At times he rates the pain 8/10. He has tried muscle relaxer and advil with little relief. He was given a strong muscle relaxer(soma)by ortho but stopped due to making him feel too drowsy. He does not remember any injury to back. Worse with sitting for long periods of time. Not tried PT recently. Denies any bowel or bladder dysfunction or saddle anesthesia.    Review of Systems  All other systems reviewed and are negative.      Objective:   Physical Exam  Constitutional: He is oriented to person, place, and time. He appears well-developed and well-nourished.  HENT:  Head: Normocephalic and atraumatic.  Cardiovascular: Normal rate, regular rhythm and normal heart sounds.   Pulmonary/Chest: Effort normal and breath sounds normal.  Musculoskeletal:  No tenderness to palpation over lumbar spine.  Normal ROM at waist and right hip.  Negative straight leg.  Tenderness over right SI joint.   Neurological: He is alert and oriented to person, place, and time.  Psychiatric: He has a normal mood and affect. His behavior is normal.          Assessment & Plan:  .Marland Kitchen.Shawn Lloyd was seen today for back pain.  Diagnoses and all orders for this visit:  SI (sacroiliac) joint inflammation (HCC) -     predniSONE (STERAPRED UNI-PAK 21 TAB) 10 MG (21) TBPK tablet; 12 day taper pack, use as directed -     DG Lumbar Spine Complete; Future -     diclofenac (VOLTAREN) 75 MG EC tablet; Take 1 tablet (75 mg total) by mouth 2 (two) times daily. -     HYDROcodone-acetaminophen (NORCO/VICODIN) 5-325 MG tablet; Take 1-2 tablets by mouth every 8 (eight) hours as needed for moderate pain. -     ketorolac (TORADOL) injection 60 mg; Inject 2 mLs (60 mg total) into the muscle  once.  Acute right-sided low back pain with right-sided sciatica -     predniSONE (STERAPRED UNI-PAK 21 TAB) 10 MG (21) TBPK tablet; 12 day taper pack, use as directed -     DG Lumbar Spine Complete; Future -     diclofenac (VOLTAREN) 75 MG EC tablet; Take 1 tablet (75 mg total) by mouth 2 (two) times daily. -     HYDROcodone-acetaminophen (NORCO/VICODIN) 5-325 MG tablet; Take 1-2 tablets by mouth every 8 (eight) hours as needed for moderate pain. -     ketorolac (TORADOL) injection 60 mg; Inject 2 mLs (60 mg total) into the muscle once.   toradol 60mg  in office today.  Prednisone, diclofenac, and exercises to start.  Encouraged tens unit, heat, ice, biofreeze. Small quanity of norco. Discussed SE's and abuse potential.  [place] database control substance reviewed with no concerns. 5 days given.

## 2016-09-29 NOTE — Progress Notes (Signed)
Call pt: narrowing of lumbar disc space. No acute changes. Continue with treatment plan discussed.

## 2016-09-30 DIAGNOSIS — M5441 Lumbago with sciatica, right side: Secondary | ICD-10-CM | POA: Insufficient documentation

## 2016-09-30 DIAGNOSIS — M461 Sacroiliitis, not elsewhere classified: Secondary | ICD-10-CM | POA: Insufficient documentation

## 2016-12-08 ENCOUNTER — Other Ambulatory Visit: Payer: Self-pay | Admitting: Physician Assistant

## 2016-12-08 DIAGNOSIS — M5441 Lumbago with sciatica, right side: Secondary | ICD-10-CM

## 2016-12-08 DIAGNOSIS — M461 Sacroiliitis, not elsewhere classified: Secondary | ICD-10-CM

## 2017-01-21 ENCOUNTER — Other Ambulatory Visit: Payer: Self-pay | Admitting: Physician Assistant

## 2017-02-09 ENCOUNTER — Encounter: Payer: Self-pay | Admitting: Physician Assistant

## 2017-02-09 ENCOUNTER — Ambulatory Visit (INDEPENDENT_AMBULATORY_CARE_PROVIDER_SITE_OTHER): Payer: 59 | Admitting: Physician Assistant

## 2017-02-09 VITALS — BP 116/65 | HR 87 | Ht 70.0 in | Wt 218.0 lb

## 2017-02-09 DIAGNOSIS — L989 Disorder of the skin and subcutaneous tissue, unspecified: Secondary | ICD-10-CM

## 2017-02-09 DIAGNOSIS — T753XXA Motion sickness, initial encounter: Secondary | ICD-10-CM

## 2017-02-09 MED ORDER — SCOPOLAMINE 1 MG/3DAYS TD PT72: 1.0000 | MEDICATED_PATCH | TRANSDERMAL | 0 refills | Status: DC

## 2017-02-09 NOTE — Patient Instructions (Signed)
Skin Biopsy  A skin biopsy is a procedure to remove a sample of your skin (lesion) so it can be checked under a microscope. You may need a skin biopsy if you have a skin disease or abnormal changes in your skin.  Tell a health care provider about:   Any allergies you have.   All medicines you are taking, including vitamins, herbs, eye drops, creams, and over-the-counter medicines.   Any problems you or family members have had with anesthetic medicines.   Any blood disorders you have.   Any surgeries you have had.   Any medical conditions you have.  What are the risks?  Generally, this is a safe procedure. However, problems may occur, including:   Infection.   Bleeding.   Allergic reaction to medicines.   Scarring.    What happens before the procedure?   Ask your health care provider about changing or stopping your regular medicines. This is especially important if you are taking diabetes medicines or blood thinners.   Follow instructions from your health care provider about how to care for your skin.   Ask your health care provider how your biopsy site will be marked or identified.   You may be given antibiotic medicine to prevent infection.  What happens during the procedure?   To reduce your risk of infection:  ? Your health care team will wash or sanitize their hands.  ? Your skin will be washed with soap.   You may be given a medicine to help you relax (sedative).   You will be givena medicine to numb the area (local anesthetic).   The method that your health care provider will use for your skin biopsy will depend on the type of skin problem you have. Options include:  ? Shave biopsy. Your health care provider will shave away layers of your skin lesion with a sharp blade. After shaving, a gel or ointment may be used to control bleeding.  ? Punch biopsy. Your health care provider will use a tool to remove all or part of the lesion. This leaves a small hole about the width of a pencil eraser.  The area may be covered with a gel or ointment.  ? Excisional or incisional biopsy. Your health care provider will use a surgical blade to remove all or part of your lesion.   Your skin biopsy site may be closed with stitches (sutures).   A bandage (dressing) will be applied.  The procedure may vary among health care providers and hospitals.  What happens after the procedure?   Your skin sample will be sent to a laboratory for examination.   Your skin biopsy site will be watched to make sure that it stops bleeding.   Do not drive for 24 hours if you received a sedative.  This information is not intended to replace advice given to you by your health care provider. Make sure you discuss any questions you have with your health care provider.  Document Released: 09/10/2004 Document Revised: 04/04/2016 Document Reviewed: 11/06/2014  Elsevier Interactive Patient Education  2018 Elsevier Inc.

## 2017-02-11 ENCOUNTER — Encounter: Payer: Self-pay | Admitting: Physician Assistant

## 2017-02-11 NOTE — Progress Notes (Signed)
   Subjective:    Patient ID: Shawn Lloyd, male    DOB: [birth year], 56 y.o.   MRN: [id number]  HPI Pt is a 56 yo male who presents to the clinic with a hard nodule on his left ear for last few weeks maybe months. He has tried to get stuff out of it with no luck. It does not hurt or itch. He does not remember and trauma to the area. He is in the sun a lot and worries about skin cancer.   He is going on a fishing trip and wants patches for nausea.   .. Active Ambulatory Problems    Diagnosis Date Noted  . NAUSEA 04/20/2009  . DIARRHEA 04/20/2009  . RUQ PAIN 04/20/2009  . GAD (generalized anxiety disorder) 02/03/2012  . MDD (major depressive disorder) 02/03/2012  . Hyperlipidemia 10/09/2014  . Burning pain 10/15/2014  . Numbness in both legs 10/15/2014  . Right leg pain 11/18/2014  . BPH without urinary obstruction 07/19/2016  . Enlarged prostate 07/19/2016  . Suicidal ideation 07/19/2016  . Acute right-sided low back pain with right-sided sciatica 09/30/2016  . SI (sacroiliac) joint inflammation (HCC) 09/30/2016   Resolved Ambulatory Problems    Diagnosis Date Noted  . Cellulitis of right lower extremity 11/18/2014   Past Medical History:  Diagnosis Date  . Anxiety   . Depression   . Hyperlipemia   . Hyperlipidemia   . Mood disorder (HCC)        Review of Systems See HPI.     Objective:   Physical Exam  Constitutional: He appears well-developed and well-nourished.  HENT:  Head:    Cardiovascular: Normal rate, regular rhythm and normal heart sounds.           Assessment & Plan:  Marland Kitchen.Marland Kitchen.Diagnoses and all orders for this visit:  Skin lesion -     Dermatology pathology  Motion sickness, initial encounter -     scopolamine (TRANSDERM-SCOP) 1 MG/3DAYS; Place 1 patch (1.5 mg total) onto the skin every 3 (three) days.   Originally used scapul to see if was a subcutaneous cyst. Nothing came out. Due to scabbed over appearance and location decided to biopsy to  confirm not any skin cancer.   Shave Biopsy Procedure Note  Pre-operative Diagnosis: Suspicious lesion  Post-operative Diagnosis: same  Locations:left middle ear auricle  Indications: concern for skin cancer  Anesthesia: Lidocaine 1% without epinephrine without added sodium bicarbonate  Procedure Details  History of allergy to iodine: no  Patient informed of the risks (including bleeding and infection) and benefits of the  procedure and Verbal informed consent obtained.  The lesion and surrounding area were given a sterile prep using chlorhexidine and draped in the usual sterile fashion. A scalpel was used to shave an area of skin approximately 1cm by 1cm.  Hemostasis achieved with alumuninum chloride and silver nitrate sticks. Antibiotic ointment and a sterile dressing applied.  The specimen was sent for pathologic examination. The patient tolerated the procedure well.  EBL:541mL   Condition: Stable  Complications: none.  Plan: 1. Instructed to keep the wound dry and covered for 24-48h and clean thereafter. 2. Warning signs of infection were reviewed.   3. Recommended that the patient use OTC acetaminophen as needed for pain.

## 2017-08-11 ENCOUNTER — Other Ambulatory Visit: Payer: Self-pay | Admitting: Physician Assistant

## 2017-10-13 ENCOUNTER — Other Ambulatory Visit: Payer: Self-pay | Admitting: Physician Assistant

## 2017-11-14 ENCOUNTER — Other Ambulatory Visit: Payer: Self-pay | Admitting: Physician Assistant

## 2017-12-22 ENCOUNTER — Other Ambulatory Visit: Payer: Self-pay | Admitting: Physician Assistant

## 2018-01-17 ENCOUNTER — Ambulatory Visit (INDEPENDENT_AMBULATORY_CARE_PROVIDER_SITE_OTHER): Payer: 59 | Admitting: Physician Assistant

## 2018-01-17 ENCOUNTER — Encounter: Payer: Self-pay | Admitting: Physician Assistant

## 2018-01-17 VITALS — BP 122/78 | HR 67 | Ht 70.0 in | Wt 206.0 lb

## 2018-01-17 DIAGNOSIS — E782 Mixed hyperlipidemia: Secondary | ICD-10-CM | POA: Diagnosis not present

## 2018-01-17 DIAGNOSIS — Z131 Encounter for screening for diabetes mellitus: Secondary | ICD-10-CM

## 2018-01-17 DIAGNOSIS — E1169 Type 2 diabetes mellitus with other specified complication: Secondary | ICD-10-CM | POA: Diagnosis not present

## 2018-01-17 DIAGNOSIS — F3341 Major depressive disorder, recurrent, in partial remission: Secondary | ICD-10-CM

## 2018-01-17 DIAGNOSIS — Z0000 Encounter for general adult medical examination without abnormal findings: Secondary | ICD-10-CM | POA: Diagnosis not present

## 2018-01-17 DIAGNOSIS — N401 Enlarged prostate with lower urinary tract symptoms: Secondary | ICD-10-CM | POA: Diagnosis not present

## 2018-01-17 DIAGNOSIS — E785 Hyperlipidemia, unspecified: Secondary | ICD-10-CM

## 2018-01-17 DIAGNOSIS — R351 Nocturia: Secondary | ICD-10-CM | POA: Diagnosis not present

## 2018-01-17 DIAGNOSIS — R5383 Other fatigue: Secondary | ICD-10-CM | POA: Diagnosis not present

## 2018-01-17 DIAGNOSIS — M19011 Primary osteoarthritis, right shoulder: Secondary | ICD-10-CM | POA: Diagnosis not present

## 2018-01-17 DIAGNOSIS — M5441 Lumbago with sciatica, right side: Secondary | ICD-10-CM | POA: Diagnosis not present

## 2018-01-17 MED ORDER — MELOXICAM 15 MG PO TABS: 15.0000 mg | ORAL_TABLET | Freq: Every day | ORAL | 2 refills | Status: DC

## 2018-01-17 NOTE — Progress Notes (Signed)
Subjective:    Patient ID: Shawn Lloyd, male    DOB: 13-Jun-1961, 57 y.o.   MRN: [id number]  HPI  Patient is a 57 year old male with history of hyperlipidemia, depression, osteoarthritis of numerous joints.  He comes in today for follow-up and labs.  He continues to have a lot of problem with chronic pain in his low back and right shoulder.  He does see [facility] [place] for this.  He was told he needs a right shoulder replacement due to his osteoarthritis.  He is taking Lipitor and Zoloft as his only 2 regular medications.  He sees psychiatry for his Zoloft.  He denies any worsening of anxiety or depression.  He denies any suicidal thoughts or ideation.  He does have a lot of ongoing fatigue.  Overall he does sleep well.  He has a little trouble going to sleep but he can stay asleep and wake up and feel rested.  He denies any overt snoring.  He does wake up a few times to urinate.  He has a history of prostate enlargement but he is not on any medications.  .. Active Ambulatory Problems    Diagnosis Date Noted  . NAUSEA 04/20/2009  . RUQ PAIN 04/20/2009  . GAD (generalized anxiety disorder) 02/03/2012  . MDD (major depressive disorder) 02/03/2012  . Hyperlipidemia 10/09/2014  . Burning pain 10/15/2014  . Numbness in both legs 10/15/2014  . Right leg pain 11/18/2014  . BPH without urinary obstruction 07/19/2016  . Enlarged prostate 07/19/2016  . Suicidal ideation 07/19/2016  . Acute right-sided low back pain with right-sided sciatica 09/30/2016  . SI (sacroiliac) joint inflammation (HCC) 09/30/2016  . Primary osteoarthritis of right shoulder 01/17/2018   Resolved Ambulatory Problems    Diagnosis Date Noted  . Diarrhea 04/20/2009  . Cellulitis of right lower extremity 11/18/2014   Past Medical History:  Diagnosis Date  . Anxiety   . Depression   . Hyperlipemia   . Hyperlipidemia   . Mood disorder (HCC)      Review of Systems  All other systems reviewed and are  negative.      Objective:   Physical Exam  Constitutional: He is oriented to person, place, and time. He appears well-developed and well-nourished.  HENT:  Head: Normocephalic and atraumatic.  Cardiovascular: Normal rate and regular rhythm.  Pulmonary/Chest: Effort normal and breath sounds normal.  Abdominal: Soft. Bowel sounds are normal.  Neurological: He is alert and oriented to person, place, and time.  Psychiatric: He has a normal mood and affect. His behavior is normal.          Assessment & Plan:  .Marland KitchenIkaika was seen today for hypertension and hyperlipidemia.  Diagnoses and all orders for this visit:  No energy -     TSH -     Testosterone -     CBC -     Ferritin -     B12 and Folate Panel -     Vitamin D 1,25 dihydroxy  Acute right-sided low back pain with right-sided sciatica  Primary osteoarthritis of right shoulder -     meloxicam (MOBIC) 15 MG tablet; Take 1 tablet (15 mg total) by mouth daily.  Recurrent major depressive disorder, in partial remission (HCC)  Screening for diabetes mellitus -     COMPLETE METABOLIC PANEL WITH GFR  Hyperlipidemia associated with type 2 diabetes mellitus (HCC)  Mixed hyperlipidemia -     Lipid Panel w/reflex Direct LDL  Benign prostatic hyperplasia  with nocturia -     PSA  Preventative health care -     Lipid Panel w/reflex Direct LDL -     COMPLETE METABOLIC PANEL WITH GFR -     TSH -     Testosterone -     CBC -     Ferritin -     B12 and Folate Panel -     Vitamin D 1,25 dihydroxy -     PSA   .Marland Kitchen Depression screen PHQ 2/9 01/17/2018  Decreased Interest 2  Down, Depressed, Hopeless 1  PHQ - 2 Score 3  Altered sleeping 2  Tired, decreased energy 2  Change in appetite 2  Feeling bad or failure about yourself  0  Trouble concentrating 1  Moving slowly or fidgety/restless 1  Suicidal thoughts 0  PHQ-9 Score 11  Difficult doing work/chores Not difficult at all   .Marland Kitchen GAD 7 : Generalized Anxiety Score  01/17/2018  Nervous, Anxious, on Edge 1  Control/stop worrying 1  Worry too much - different things 2  Trouble relaxing 1  Restless 1  Easily annoyed or irritable 2  Afraid - awful might happen 0  Total GAD 7 Score 8  Anxiety Difficulty Not difficult at all    Patient's PHQ 9 and GAD 7 scores were elevated however patient does not feel like this is of concern.  I do wish that he follow-up with his psychiatrist in the future if we do not find any reason for his fatigue.  I am doing significant amount of labs to follow-up on any metabolic reason for no energy.  His blood pressure is well controlled today.  We will check his cholesterol and refill medications as needed.  I do suspect chronic pain could be playing a role in his energy level.  Strongly encouraged him to follow-up with [facility] and consider the shoulder replacement or at least injections to help with pain.  I will replace his ibuprofen with meloxicam and see if he gets any better benefit.  Marland Kitchen.Spent 30 minutes with patient and greater than 50 percent of visit spent counseling patient regarding treatment plan.

## 2018-01-18 NOTE — Progress Notes (Signed)
Call pt: cholesterol looks great. Thyroid normal. Testosterone good.  Vitamin b12 is low. We can try oral supplementation or start injections and see if an get up faster. Which would you like?  Vitamin D is pending.

## 2018-01-20 LAB — LIPID PANEL W/REFLEX DIRECT LDL
CHOL/HDL RATIO: 2.8 (calc) (ref <5.0)
CHOLESTEROL: 162 mg/dL (ref <200)
HDL: 58 mg/dL (ref >40)
LDL CHOLESTEROL (CALC): 91 mg/dL
Non-HDL Cholesterol (Calc): 104 mg/dL (calc) (ref <130)
TRIGLYCERIDES: 45 mg/dL (ref <150)

## 2018-01-20 LAB — CBC
HCT: 42.5 % (ref 38.5–50.0)
HEMOGLOBIN: 14.7 g/dL (ref 13.2–17.1)
MCH: 31.3 pg (ref 27.0–33.0)
MCHC: 34.6 g/dL (ref 32.0–36.0)
MCV: 90.6 fL (ref 80.0–100.0)
MPV: 12 fL (ref 7.5–12.5)
Platelets: 166 10*3/uL (ref 140–400)
RBC: 4.69 10*6/uL (ref 4.20–5.80)
RDW: 12.4 % (ref 11.0–15.0)
WBC: 3.7 10*3/uL — AB (ref 3.8–10.8)

## 2018-01-20 LAB — COMPLETE METABOLIC PANEL WITH GFR
AG RATIO: 1.8 (calc) (ref 1.0–2.5)
ALT: 20 U/L (ref 9–46)
AST: 22 U/L (ref 10–35)
Albumin: 4.6 g/dL (ref 3.6–5.1)
Alkaline phosphatase (APISO): 68 U/L (ref 40–115)
BUN/Creatinine Ratio: 33 (calc) — ABNORMAL HIGH (ref 6–22)
BUN: 31 mg/dL — ABNORMAL HIGH (ref 7–25)
CO2: 25 mmol/L (ref 20–32)
Calcium: 9.2 mg/dL (ref 8.6–10.3)
Chloride: 104 mmol/L (ref 98–110)
Creat: 0.95 mg/dL (ref 0.70–1.33)
GFR, EST NON AFRICAN AMERICAN: 88 mL/min/{1.73_m2} (ref >=60)
GFR, Est African American: 103 mL/min/{1.73_m2} (ref >=60)
GLOBULIN: 2.5 g/dL (ref 1.9–3.7)
Glucose, Bld: 99 mg/dL (ref 65–99)
POTASSIUM: 4.2 mmol/L (ref 3.5–5.3)
Sodium: 136 mmol/L (ref 135–146)
Total Bilirubin: 0.6 mg/dL (ref 0.2–1.2)
Total Protein: 7.1 g/dL (ref 6.1–8.1)

## 2018-01-20 LAB — VITAMIN D 1,25 DIHYDROXY
VITAMIN D 1, 25 (OH) TOTAL: 51 pg/mL (ref 18–72)
VITAMIN D3 1, 25 (OH): 51 pg/mL

## 2018-01-20 LAB — PSA: PSA: 0.4 ng/mL (ref <=4.0)

## 2018-01-20 LAB — FERRITIN: Ferritin: 165 ng/mL (ref 20–380)

## 2018-01-20 LAB — TESTOSTERONE: Testosterone: 439 ng/dL (ref 250–827)

## 2018-01-20 LAB — B12 AND FOLATE PANEL
FOLATE: 10.5 ng/mL
Vitamin B-12: 205 pg/mL (ref 200–1100)

## 2018-01-20 LAB — TSH: TSH: 1.2 mIU/L (ref 0.40–4.50)

## 2018-01-20 NOTE — Progress Notes (Signed)
Please send letter. Thanks.

## 2018-01-25 ENCOUNTER — Other Ambulatory Visit: Payer: Self-pay | Admitting: Physician Assistant

## 2018-03-09 DIAGNOSIS — F331 Major depressive disorder, recurrent, moderate: Secondary | ICD-10-CM | POA: Diagnosis not present

## 2018-03-09 DIAGNOSIS — F411 Generalized anxiety disorder: Secondary | ICD-10-CM | POA: Diagnosis not present

## 2018-06-02 ENCOUNTER — Other Ambulatory Visit: Payer: Self-pay | Admitting: Physician Assistant

## 2018-06-02 DIAGNOSIS — M19011 Primary osteoarthritis, right shoulder: Secondary | ICD-10-CM

## 2018-06-06 DIAGNOSIS — Z23 Encounter for immunization: Secondary | ICD-10-CM | POA: Diagnosis not present

## 2018-06-08 DIAGNOSIS — F331 Major depressive disorder, recurrent, moderate: Secondary | ICD-10-CM | POA: Diagnosis not present

## 2018-06-08 DIAGNOSIS — F411 Generalized anxiety disorder: Secondary | ICD-10-CM | POA: Diagnosis not present

## 2018-09-01 ENCOUNTER — Other Ambulatory Visit: Payer: Self-pay | Admitting: Physician Assistant

## 2018-09-01 DIAGNOSIS — M19011 Primary osteoarthritis, right shoulder: Secondary | ICD-10-CM

## 2018-09-06 DIAGNOSIS — F411 Generalized anxiety disorder: Secondary | ICD-10-CM | POA: Diagnosis not present

## 2018-09-06 DIAGNOSIS — F331 Major depressive disorder, recurrent, moderate: Secondary | ICD-10-CM | POA: Diagnosis not present

## 2018-09-22 DIAGNOSIS — M25511 Pain in right shoulder: Secondary | ICD-10-CM | POA: Diagnosis not present

## 2018-10-06 ENCOUNTER — Other Ambulatory Visit: Payer: Self-pay | Admitting: Physician Assistant

## 2018-10-06 DIAGNOSIS — M19011 Primary osteoarthritis, right shoulder: Secondary | ICD-10-CM

## 2018-10-09 ENCOUNTER — Other Ambulatory Visit: Payer: Self-pay | Admitting: Physician Assistant

## 2018-10-09 DIAGNOSIS — M19011 Primary osteoarthritis, right shoulder: Secondary | ICD-10-CM

## 2018-10-10 NOTE — Telephone Encounter (Signed)
Needs an appoinment

## 2018-10-27 DIAGNOSIS — M549 Dorsalgia, unspecified: Secondary | ICD-10-CM | POA: Diagnosis not present

## 2018-11-03 DIAGNOSIS — Z135 Encounter for screening for eye and ear disorders: Secondary | ICD-10-CM | POA: Diagnosis not present

## 2018-11-03 DIAGNOSIS — T1590XA Foreign body on external eye, part unspecified, unspecified eye, initial encounter: Secondary | ICD-10-CM | POA: Diagnosis not present

## 2018-11-03 DIAGNOSIS — M549 Dorsalgia, unspecified: Secondary | ICD-10-CM | POA: Diagnosis not present

## 2018-11-08 DIAGNOSIS — M6281 Muscle weakness (generalized): Secondary | ICD-10-CM | POA: Diagnosis not present

## 2018-11-08 DIAGNOSIS — M25552 Pain in left hip: Secondary | ICD-10-CM | POA: Diagnosis not present

## 2018-11-08 DIAGNOSIS — R2689 Other abnormalities of gait and mobility: Secondary | ICD-10-CM | POA: Diagnosis not present

## 2018-11-08 DIAGNOSIS — M545 Low back pain: Secondary | ICD-10-CM | POA: Diagnosis not present

## 2018-11-09 DIAGNOSIS — R2689 Other abnormalities of gait and mobility: Secondary | ICD-10-CM | POA: Diagnosis not present

## 2018-11-09 DIAGNOSIS — M545 Low back pain: Secondary | ICD-10-CM | POA: Diagnosis not present

## 2018-11-09 DIAGNOSIS — M6281 Muscle weakness (generalized): Secondary | ICD-10-CM | POA: Diagnosis not present

## 2018-11-13 DIAGNOSIS — M545 Low back pain: Secondary | ICD-10-CM | POA: Diagnosis not present

## 2018-11-13 DIAGNOSIS — R2689 Other abnormalities of gait and mobility: Secondary | ICD-10-CM | POA: Diagnosis not present

## 2018-11-13 DIAGNOSIS — M6281 Muscle weakness (generalized): Secondary | ICD-10-CM | POA: Diagnosis not present

## 2018-11-14 DIAGNOSIS — M6281 Muscle weakness (generalized): Secondary | ICD-10-CM | POA: Diagnosis not present

## 2018-11-14 DIAGNOSIS — R2689 Other abnormalities of gait and mobility: Secondary | ICD-10-CM | POA: Diagnosis not present

## 2018-11-14 DIAGNOSIS — M545 Low back pain: Secondary | ICD-10-CM | POA: Diagnosis not present

## 2018-11-16 DIAGNOSIS — R2689 Other abnormalities of gait and mobility: Secondary | ICD-10-CM | POA: Diagnosis not present

## 2018-11-16 DIAGNOSIS — M6281 Muscle weakness (generalized): Secondary | ICD-10-CM | POA: Diagnosis not present

## 2018-11-16 DIAGNOSIS — M545 Low back pain: Secondary | ICD-10-CM | POA: Diagnosis not present

## 2018-11-16 DIAGNOSIS — M25552 Pain in left hip: Secondary | ICD-10-CM | POA: Diagnosis not present

## 2018-11-20 DIAGNOSIS — R2689 Other abnormalities of gait and mobility: Secondary | ICD-10-CM | POA: Diagnosis not present

## 2018-11-20 DIAGNOSIS — M6281 Muscle weakness (generalized): Secondary | ICD-10-CM | POA: Diagnosis not present

## 2018-11-20 DIAGNOSIS — M545 Low back pain: Secondary | ICD-10-CM | POA: Diagnosis not present

## 2018-11-22 DIAGNOSIS — R2689 Other abnormalities of gait and mobility: Secondary | ICD-10-CM | POA: Diagnosis not present

## 2018-11-22 DIAGNOSIS — M545 Low back pain: Secondary | ICD-10-CM | POA: Diagnosis not present

## 2018-11-22 DIAGNOSIS — M25552 Pain in left hip: Secondary | ICD-10-CM | POA: Diagnosis not present

## 2018-11-23 DIAGNOSIS — M545 Low back pain: Secondary | ICD-10-CM | POA: Diagnosis not present

## 2018-11-23 DIAGNOSIS — R2689 Other abnormalities of gait and mobility: Secondary | ICD-10-CM | POA: Diagnosis not present

## 2018-11-23 DIAGNOSIS — M6281 Muscle weakness (generalized): Secondary | ICD-10-CM | POA: Diagnosis not present

## 2018-11-23 DIAGNOSIS — M25552 Pain in left hip: Secondary | ICD-10-CM | POA: Diagnosis not present

## 2018-11-27 DIAGNOSIS — M6281 Muscle weakness (generalized): Secondary | ICD-10-CM | POA: Diagnosis not present

## 2018-11-27 DIAGNOSIS — M545 Low back pain: Secondary | ICD-10-CM | POA: Diagnosis not present

## 2018-11-29 DIAGNOSIS — M25552 Pain in left hip: Secondary | ICD-10-CM | POA: Diagnosis not present

## 2018-11-29 DIAGNOSIS — M545 Low back pain: Secondary | ICD-10-CM | POA: Diagnosis not present

## 2018-11-29 DIAGNOSIS — M6281 Muscle weakness (generalized): Secondary | ICD-10-CM | POA: Diagnosis not present

## 2018-12-04 DIAGNOSIS — M6281 Muscle weakness (generalized): Secondary | ICD-10-CM | POA: Diagnosis not present

## 2018-12-04 DIAGNOSIS — M25552 Pain in left hip: Secondary | ICD-10-CM | POA: Diagnosis not present

## 2018-12-04 DIAGNOSIS — M545 Low back pain: Secondary | ICD-10-CM | POA: Diagnosis not present

## 2018-12-06 DIAGNOSIS — M25552 Pain in left hip: Secondary | ICD-10-CM | POA: Diagnosis not present

## 2018-12-06 DIAGNOSIS — M6281 Muscle weakness (generalized): Secondary | ICD-10-CM | POA: Diagnosis not present

## 2018-12-06 DIAGNOSIS — M545 Low back pain: Secondary | ICD-10-CM | POA: Diagnosis not present

## 2018-12-12 DIAGNOSIS — F411 Generalized anxiety disorder: Secondary | ICD-10-CM | POA: Diagnosis not present

## 2018-12-12 DIAGNOSIS — F331 Major depressive disorder, recurrent, moderate: Secondary | ICD-10-CM | POA: Diagnosis not present

## 2018-12-13 DIAGNOSIS — M545 Low back pain: Secondary | ICD-10-CM | POA: Diagnosis not present

## 2018-12-13 DIAGNOSIS — M6281 Muscle weakness (generalized): Secondary | ICD-10-CM | POA: Diagnosis not present

## 2018-12-14 DIAGNOSIS — M545 Low back pain: Secondary | ICD-10-CM | POA: Diagnosis not present

## 2018-12-14 DIAGNOSIS — M6281 Muscle weakness (generalized): Secondary | ICD-10-CM | POA: Diagnosis not present

## 2018-12-14 DIAGNOSIS — R2689 Other abnormalities of gait and mobility: Secondary | ICD-10-CM | POA: Diagnosis not present

## 2018-12-18 DIAGNOSIS — M6281 Muscle weakness (generalized): Secondary | ICD-10-CM | POA: Diagnosis not present

## 2018-12-18 DIAGNOSIS — M545 Low back pain: Secondary | ICD-10-CM | POA: Diagnosis not present

## 2018-12-18 DIAGNOSIS — R2689 Other abnormalities of gait and mobility: Secondary | ICD-10-CM | POA: Diagnosis not present

## 2018-12-21 DIAGNOSIS — M545 Low back pain: Secondary | ICD-10-CM | POA: Diagnosis not present

## 2018-12-21 DIAGNOSIS — M6281 Muscle weakness (generalized): Secondary | ICD-10-CM | POA: Diagnosis not present

## 2018-12-21 DIAGNOSIS — R2689 Other abnormalities of gait and mobility: Secondary | ICD-10-CM | POA: Diagnosis not present

## 2018-12-27 DIAGNOSIS — M545 Low back pain: Secondary | ICD-10-CM | POA: Diagnosis not present

## 2018-12-27 DIAGNOSIS — M6281 Muscle weakness (generalized): Secondary | ICD-10-CM | POA: Diagnosis not present

## 2018-12-27 DIAGNOSIS — R262 Difficulty in walking, not elsewhere classified: Secondary | ICD-10-CM | POA: Diagnosis not present

## 2019-01-01 DIAGNOSIS — M545 Low back pain: Secondary | ICD-10-CM | POA: Diagnosis not present

## 2019-01-01 DIAGNOSIS — M6281 Muscle weakness (generalized): Secondary | ICD-10-CM | POA: Diagnosis not present

## 2019-01-01 DIAGNOSIS — R2689 Other abnormalities of gait and mobility: Secondary | ICD-10-CM | POA: Diagnosis not present

## 2019-01-11 DIAGNOSIS — M545 Low back pain: Secondary | ICD-10-CM | POA: Diagnosis not present

## 2019-01-11 DIAGNOSIS — M6281 Muscle weakness (generalized): Secondary | ICD-10-CM | POA: Diagnosis not present

## 2019-01-17 DIAGNOSIS — M6281 Muscle weakness (generalized): Secondary | ICD-10-CM | POA: Diagnosis not present

## 2019-01-17 DIAGNOSIS — M545 Low back pain: Secondary | ICD-10-CM | POA: Diagnosis not present

## 2019-01-22 DIAGNOSIS — M6281 Muscle weakness (generalized): Secondary | ICD-10-CM | POA: Diagnosis not present

## 2019-01-22 DIAGNOSIS — M545 Low back pain: Secondary | ICD-10-CM | POA: Diagnosis not present

## 2019-01-22 DIAGNOSIS — R2689 Other abnormalities of gait and mobility: Secondary | ICD-10-CM | POA: Diagnosis not present

## 2019-03-13 DIAGNOSIS — F411 Generalized anxiety disorder: Secondary | ICD-10-CM | POA: Diagnosis not present

## 2019-03-13 DIAGNOSIS — F331 Major depressive disorder, recurrent, moderate: Secondary | ICD-10-CM | POA: Diagnosis not present

## 2019-03-13 DIAGNOSIS — M25511 Pain in right shoulder: Secondary | ICD-10-CM | POA: Diagnosis not present

## 2019-03-21 ENCOUNTER — Encounter: Payer: Self-pay | Admitting: Emergency Medicine

## 2019-03-21 ENCOUNTER — Other Ambulatory Visit: Payer: Self-pay

## 2019-03-21 ENCOUNTER — Telehealth: Payer: Self-pay

## 2019-03-21 ENCOUNTER — Emergency Department (INDEPENDENT_AMBULATORY_CARE_PROVIDER_SITE_OTHER)
Admission: EM | Admit: 2019-03-21 | Discharge: 2019-03-21 | Disposition: A | Discharge status: Home / Self Care | Payer: BC Managed Care – PPO | Source: Home / Self Care | Attending: Family Medicine | Admitting: Family Medicine

## 2019-03-21 DIAGNOSIS — K209 Esophagitis, unspecified without bleeding: Secondary | ICD-10-CM

## 2019-03-21 MED ORDER — ESOMEPRAZOLE MAGNESIUM 40 MG PO CPDR: DELAYED_RELEASE_CAPSULE | ORAL | 1 refills | Status: DC

## 2019-03-21 NOTE — ED Provider Notes (Signed)
[facility]    CSN: [id number] Arrival date & time: 03/21/19  1334     History   Chief Complaint Chief Complaint  Patient presents with   Chest Pain    HPI [facility] is a 58 y.o. male.   While swallowing his usual medication in the morning 4 days ago, patient developed sudden pain with swallowing that has persisted.  He now has pain swallowing even water.  He denies nausea/vomiting, and notes that Tums and Pepcid help somewhat.  His bowel movements have been normal. He has a past history of reflux.  The history is provided by the patient.    Past Medical History:  Diagnosis Date   Anxiety    Depression    Hyperlipemia    Hyperlipidemia    Mood disorder [facility](HCC)     Patient Active Problem List   Diagnosis Date Noted   Primary osteoarthritis of right shoulder 01/17/2018   Acute right-sided low back pain with right-sided sciatica 09/30/2016   SI (sacroiliac) joint inflammation (HCC) 09/30/2016   BPH without urinary obstruction 07/19/2016   Enlarged prostate 07/19/2016   Suicidal ideation 07/19/2016   Right leg pain 11/18/2014   Burning pain 10/15/2014   Numbness in both legs 10/15/2014   Hyperlipidemia 10/09/2014   GAD (generalized anxiety disorder) 02/03/2012   MDD (major depressive disorder) 02/03/2012   NAUSEA 04/20/2009   RUQ PAIN 04/20/2009    Past Surgical History:  Procedure Laterality Date   left shoulder surgery     SHOULDER SURGERY         Home Medications    Prior to Admission medications   Medication Sig Start Date End Date Taking? Authorizing Provider  atorvastatin (LIPITOR) 40 MG tablet Take 1 tablet (40 mg total) by mouth daily. 01/25/18   Jomarie LongsBreeback, Jade L, PA-C  esomeprazole (NEXIUM) 40 MG capsule Take one cap by mouth each morning, 45 minutes before eating 03/21/19   Lattie HawBeese, Russ Looper A, MD  meloxicam (MOBIC) 15 MG tablet Take 1 tablet (15 mg total) by mouth daily. Needs Appointment 10/10/18   Jomarie LongsBreeback, Jade  L, PA-C  sertraline (ZOLOFT) 100 MG tablet TAKE 1 TABLET EVERY DAY 11/30/13   Puthuvel, Iven FinnShaji J, MD  escitalopram (LEXAPRO) 10 MG tablet Take 10 mg by mouth daily.  11/01/11  [provider]    Family History Family History  Problem Relation Age of Onset   Bipolar disorder Mother    Heart failure Mother    Hypertension Mother    Cancer Father        throat, prostate   Hypertension Father    Hyperlipidemia Father     Social History Social History   Tobacco Use   Smoking status: Never Smoker   Smokeless tobacco: Never Used  Substance Use Topics   Alcohol use: Yes   Drug use: No     Allergies   Lyrica [pregabalin]   Review of Systems Review of Systems  Constitutional: Negative.   HENT: Negative.   Eyes: Negative.   Respiratory: Negative.   Cardiovascular: Positive for chest pain.  Gastrointestinal: Positive for abdominal pain. Negative for abdominal distention, blood in stool, constipation, diarrhea, nausea and vomiting.  Genitourinary: Negative.   Musculoskeletal: Negative.   Skin: Negative.   Neurological: Negative.      Physical Exam Triage Vital Signs ED Triage Vitals  Enc Vitals Group     BP 03/21/19 1427 118/78     Pulse Rate 03/21/19 1427 72     Resp --  Temp 03/21/19 1427 97.8 F (36.6 C)     Temp Source 03/21/19 1427 Oral     SpO2 03/21/19 1427 97 %     Weight 03/21/19 1428 175 lb (79.4 kg)     Height 03/21/19 1428 5\' 9"  (1.753 m)     Head Circumference --      Peak Flow --      Pain Score 03/21/19 1428 3     Pain Loc --      Pain Edu? --      Excl. in [place]? --    No data found.  Updated Vital Signs BP 118/78 (BP Location: Right Arm)    Pulse 72    Temp 97.8 F (36.6 C) (Oral)    Ht 5\' 9"  (1.753 m)    Wt 79.4 kg    SpO2 97%    BMI 25.84 kg/m   Visual Acuity Right Eye Distance:   Left Eye Distance:   Bilateral Distance:    Right Eye Near:   Left Eye Near:    Bilateral Near:     Physical Exam Nursing notes and  Vital Signs reviewed. Appearance:  Patient appears stated age, and in no acute distress.    Eyes:  Pupils are equal, round, and reactive to light and accomodation.  Extraocular movement is intact.  Conjunctivae are not inflamed   Pharynx:  Normal; moist mucous membranes  Neck:  Supple.  No adenopathy Lungs:  Clear to auscultation.  Breath sounds are equal.  Moving air well. Heart:  Regular rate and rhythm without murmurs, rubs, or gallops.  Abdomen:  Localized tenderness sub-xiphoid area without masses or hepatosplenomegaly.  Bowel sounds are present.  No CVA or flank tenderness.  Extremities:  No edema.  Skin:  No rash present.      UC Treatments / Results  Labs (all labs ordered are listed, but only abnormal results are displayed) Labs Reviewed - No data to display  EKG  Rate:  73 BPM PR:  162 msec QT:  422 msec QTcH:  464 msec QRSD:  94 msec QRS axis:  45 degrees Interpretation:  within normal limits   Radiology No results found.  Procedures Procedures (including critical care time)  Medications Ordered in UC Medications - No data to display  Initial Impression / Assessment and Plan / UC Course  I have reviewed the triage vital signs and the nursing notes.  Pertinent labs & imaging results that were available during my care of the patient were reviewed by me and considered in my medical decision making (see chart for details).    Normal EKG reassuring. Begin Nexium 40mg  daily. Followup with Family Doctor if not improved in one week.    Final Clinical Impressions(s) / UC Diagnoses   Final diagnoses:  Acute esophagitis     Discharge Instructions     May continue Pepcid at bedtime.    ED Prescriptions    Medication Sig Dispense Auth. Provider   esomeprazole (NEXIUM) 40 MG capsule Take one cap by mouth each morning, 45 minutes before eating 30 capsule Kandra Nicolas, MD        Kandra Nicolas, MD 03/22/19 [phone]

## 2019-03-21 NOTE — Discharge Instructions (Addendum)
May continue Pepcid at bedtime.

## 2019-03-21 NOTE — Telephone Encounter (Signed)
Seen in UC today

## 2019-03-21 NOTE — Telephone Encounter (Signed)
Ok if he does not go to ER today. He needs to be seen tomorrow by me.

## 2019-03-21 NOTE — ED Triage Notes (Signed)
Chest pain, indigestion x 3 days

## 2019-03-21 NOTE — Telephone Encounter (Signed)
Patient called stating that he took a Meloxicam on Sunday for the first time in a while and had a reaction. Reports he had a tightness in his chest and a lot of pressure. Patient states that it hurts to eat and hurts to swallow and "I feel like my throat is swollen on the inside". Patient states he was giving it a couple days and although SX not getting worse, they are also not improving.  Advised patient to seek care today at urgent care to be evaluated. Advised him to schedule follow up with [facility] after evaluation since he is past due for a visit.   FYI to PCP

## 2019-03-22 DIAGNOSIS — Z01812 Encounter for preprocedural laboratory examination: Secondary | ICD-10-CM | POA: Diagnosis not present

## 2019-03-22 DIAGNOSIS — M25562 Pain in left knee: Secondary | ICD-10-CM | POA: Diagnosis not present

## 2019-03-22 DIAGNOSIS — Z01818 Encounter for other preprocedural examination: Secondary | ICD-10-CM | POA: Diagnosis not present

## 2019-03-22 DIAGNOSIS — T84033A Mechanical loosening of internal left knee prosthetic joint, initial encounter: Secondary | ICD-10-CM | POA: Diagnosis not present

## 2019-03-22 DIAGNOSIS — Z01810 Encounter for preprocedural cardiovascular examination: Secondary | ICD-10-CM | POA: Diagnosis not present

## 2019-03-25 DIAGNOSIS — M19011 Primary osteoarthritis, right shoulder: Secondary | ICD-10-CM | POA: Diagnosis not present

## 2019-03-25 DIAGNOSIS — Z01812 Encounter for preprocedural laboratory examination: Secondary | ICD-10-CM | POA: Diagnosis not present

## 2019-03-25 DIAGNOSIS — Z1159 Encounter for screening for other viral diseases: Secondary | ICD-10-CM | POA: Diagnosis not present

## 2019-03-29 DIAGNOSIS — Z96611 Presence of right artificial shoulder joint: Secondary | ICD-10-CM | POA: Diagnosis not present

## 2019-03-29 DIAGNOSIS — E7800 Pure hypercholesterolemia, unspecified: Secondary | ICD-10-CM | POA: Diagnosis not present

## 2019-03-29 DIAGNOSIS — G8918 Other acute postprocedural pain: Secondary | ICD-10-CM | POA: Diagnosis not present

## 2019-03-29 DIAGNOSIS — Z6825 Body mass index (BMI) 25.0-25.9, adult: Secondary | ICD-10-CM | POA: Diagnosis not present

## 2019-03-29 DIAGNOSIS — M19011 Primary osteoarthritis, right shoulder: Secondary | ICD-10-CM | POA: Diagnosis not present

## 2019-03-29 DIAGNOSIS — Z791 Long term (current) use of non-steroidal anti-inflammatories (NSAID): Secondary | ICD-10-CM | POA: Diagnosis not present

## 2019-03-29 DIAGNOSIS — G609 Hereditary and idiopathic neuropathy, unspecified: Secondary | ICD-10-CM | POA: Diagnosis not present

## 2019-03-29 DIAGNOSIS — Z471 Aftercare following joint replacement surgery: Secondary | ICD-10-CM | POA: Diagnosis not present

## 2019-03-29 DIAGNOSIS — Z79899 Other long term (current) drug therapy: Secondary | ICD-10-CM | POA: Diagnosis not present

## 2019-03-29 DIAGNOSIS — E663 Overweight: Secondary | ICD-10-CM | POA: Diagnosis not present

## 2019-03-30 LAB — HEMOGLOBIN A1C: Hemoglobin A1C: 5.3

## 2019-04-05 DIAGNOSIS — R509 Fever, unspecified: Secondary | ICD-10-CM | POA: Diagnosis not present

## 2019-04-05 DIAGNOSIS — Z79899 Other long term (current) drug therapy: Secondary | ICD-10-CM | POA: Diagnosis not present

## 2019-04-05 DIAGNOSIS — R079 Chest pain, unspecified: Secondary | ICD-10-CM | POA: Diagnosis not present

## 2019-04-05 DIAGNOSIS — Z791 Long term (current) use of non-steroidal anti-inflammatories (NSAID): Secondary | ICD-10-CM | POA: Diagnosis not present

## 2019-04-05 DIAGNOSIS — R0602 Shortness of breath: Secondary | ICD-10-CM | POA: Diagnosis not present

## 2019-04-05 DIAGNOSIS — Z20828 Contact with and (suspected) exposure to other viral communicable diseases: Secondary | ICD-10-CM | POA: Diagnosis not present

## 2019-04-11 DIAGNOSIS — M25511 Pain in right shoulder: Secondary | ICD-10-CM | POA: Diagnosis not present

## 2019-04-23 DIAGNOSIS — M25511 Pain in right shoulder: Secondary | ICD-10-CM | POA: Diagnosis not present

## 2019-04-23 DIAGNOSIS — M6281 Muscle weakness (generalized): Secondary | ICD-10-CM | POA: Diagnosis not present

## 2019-04-25 DIAGNOSIS — M6281 Muscle weakness (generalized): Secondary | ICD-10-CM | POA: Diagnosis not present

## 2019-04-25 DIAGNOSIS — M25511 Pain in right shoulder: Secondary | ICD-10-CM | POA: Diagnosis not present

## 2019-05-03 DIAGNOSIS — M6281 Muscle weakness (generalized): Secondary | ICD-10-CM | POA: Diagnosis not present

## 2019-05-03 DIAGNOSIS — M25511 Pain in right shoulder: Secondary | ICD-10-CM | POA: Diagnosis not present

## 2019-05-08 DIAGNOSIS — M6281 Muscle weakness (generalized): Secondary | ICD-10-CM | POA: Diagnosis not present

## 2019-05-08 DIAGNOSIS — M25531 Pain in right wrist: Secondary | ICD-10-CM | POA: Diagnosis not present

## 2019-05-08 DIAGNOSIS — M25511 Pain in right shoulder: Secondary | ICD-10-CM | POA: Diagnosis not present

## 2019-05-10 DIAGNOSIS — M6281 Muscle weakness (generalized): Secondary | ICD-10-CM | POA: Diagnosis not present

## 2019-05-10 DIAGNOSIS — M25531 Pain in right wrist: Secondary | ICD-10-CM | POA: Diagnosis not present

## 2019-05-10 DIAGNOSIS — M25511 Pain in right shoulder: Secondary | ICD-10-CM | POA: Diagnosis not present

## 2019-05-15 DIAGNOSIS — M6281 Muscle weakness (generalized): Secondary | ICD-10-CM | POA: Diagnosis not present

## 2019-05-15 DIAGNOSIS — M25511 Pain in right shoulder: Secondary | ICD-10-CM | POA: Diagnosis not present

## 2019-05-17 DIAGNOSIS — M25511 Pain in right shoulder: Secondary | ICD-10-CM | POA: Diagnosis not present

## 2019-05-17 DIAGNOSIS — M6281 Muscle weakness (generalized): Secondary | ICD-10-CM | POA: Diagnosis not present

## 2019-05-21 DIAGNOSIS — M25511 Pain in right shoulder: Secondary | ICD-10-CM | POA: Diagnosis not present

## 2019-05-21 DIAGNOSIS — M6281 Muscle weakness (generalized): Secondary | ICD-10-CM | POA: Diagnosis not present

## 2019-05-23 DIAGNOSIS — M6281 Muscle weakness (generalized): Secondary | ICD-10-CM | POA: Diagnosis not present

## 2019-05-23 DIAGNOSIS — M25511 Pain in right shoulder: Secondary | ICD-10-CM | POA: Diagnosis not present

## 2019-05-28 DIAGNOSIS — M6281 Muscle weakness (generalized): Secondary | ICD-10-CM | POA: Diagnosis not present

## 2019-05-28 DIAGNOSIS — M25511 Pain in right shoulder: Secondary | ICD-10-CM | POA: Diagnosis not present

## 2019-05-30 DIAGNOSIS — M25511 Pain in right shoulder: Secondary | ICD-10-CM | POA: Diagnosis not present

## 2019-05-30 DIAGNOSIS — M6281 Muscle weakness (generalized): Secondary | ICD-10-CM | POA: Diagnosis not present

## 2019-06-05 DIAGNOSIS — M6281 Muscle weakness (generalized): Secondary | ICD-10-CM | POA: Diagnosis not present

## 2019-06-05 DIAGNOSIS — M25511 Pain in right shoulder: Secondary | ICD-10-CM | POA: Diagnosis not present

## 2019-06-07 DIAGNOSIS — M25531 Pain in right wrist: Secondary | ICD-10-CM | POA: Diagnosis not present

## 2019-06-07 DIAGNOSIS — M25511 Pain in right shoulder: Secondary | ICD-10-CM | POA: Diagnosis not present

## 2019-06-07 DIAGNOSIS — M6281 Muscle weakness (generalized): Secondary | ICD-10-CM | POA: Diagnosis not present

## 2019-06-11 DIAGNOSIS — M25531 Pain in right wrist: Secondary | ICD-10-CM | POA: Diagnosis not present

## 2019-06-11 DIAGNOSIS — M25511 Pain in right shoulder: Secondary | ICD-10-CM | POA: Diagnosis not present

## 2019-06-11 DIAGNOSIS — M6281 Muscle weakness (generalized): Secondary | ICD-10-CM | POA: Diagnosis not present

## 2019-06-13 DIAGNOSIS — F411 Generalized anxiety disorder: Secondary | ICD-10-CM | POA: Diagnosis not present

## 2019-06-13 DIAGNOSIS — F331 Major depressive disorder, recurrent, moderate: Secondary | ICD-10-CM | POA: Diagnosis not present

## 2019-06-13 DIAGNOSIS — M6281 Muscle weakness (generalized): Secondary | ICD-10-CM | POA: Diagnosis not present

## 2019-06-13 DIAGNOSIS — M25511 Pain in right shoulder: Secondary | ICD-10-CM | POA: Diagnosis not present

## 2019-06-15 ENCOUNTER — Ambulatory Visit (INDEPENDENT_AMBULATORY_CARE_PROVIDER_SITE_OTHER): Payer: BC Managed Care – PPO

## 2019-06-15 ENCOUNTER — Other Ambulatory Visit: Payer: Self-pay

## 2019-06-15 ENCOUNTER — Ambulatory Visit (INDEPENDENT_AMBULATORY_CARE_PROVIDER_SITE_OTHER): Payer: BC Managed Care – PPO | Admitting: Physician Assistant

## 2019-06-15 ENCOUNTER — Encounter: Payer: Self-pay | Admitting: Physician Assistant

## 2019-06-15 VITALS — BP 115/83 | HR 62 | Ht 69.0 in | Wt 197.0 lb

## 2019-06-15 DIAGNOSIS — Z131 Encounter for screening for diabetes mellitus: Secondary | ICD-10-CM | POA: Diagnosis not present

## 2019-06-15 DIAGNOSIS — Z23 Encounter for immunization: Secondary | ICD-10-CM | POA: Diagnosis not present

## 2019-06-15 DIAGNOSIS — M47812 Spondylosis without myelopathy or radiculopathy, cervical region: Secondary | ICD-10-CM | POA: Diagnosis not present

## 2019-06-15 DIAGNOSIS — Z13220 Encounter for screening for lipoid disorders: Secondary | ICD-10-CM

## 2019-06-15 DIAGNOSIS — Z0000 Encounter for general adult medical examination without abnormal findings: Secondary | ICD-10-CM

## 2019-06-15 DIAGNOSIS — M5412 Radiculopathy, cervical region: Secondary | ICD-10-CM

## 2019-06-15 DIAGNOSIS — R29898 Other symptoms and signs involving the musculoskeletal system: Secondary | ICD-10-CM

## 2019-06-15 DIAGNOSIS — Z125 Encounter for screening for malignant neoplasm of prostate: Secondary | ICD-10-CM | POA: Diagnosis not present

## 2019-06-15 DIAGNOSIS — R200 Anesthesia of skin: Secondary | ICD-10-CM

## 2019-06-15 DIAGNOSIS — R202 Paresthesia of skin: Secondary | ICD-10-CM

## 2019-06-15 NOTE — Patient Instructions (Signed)
Health Maintenance After Age 58 After age 58, you are at a higher risk for certain long-term diseases and infections as well as injuries from falls. Falls are a major cause of broken bones and head injuries in people who are older than age 58. Getting regular preventive care can help to keep you healthy and well. Preventive care includes getting regular testing and making lifestyle changes as recommended by your health care provider. Talk with your health care provider about:  Which screenings and tests you should have. A screening is a test that checks for a disease when you have no symptoms.  A diet and exercise plan that is right for you. What should I know about screenings and tests to prevent falls? Screening and testing are the best ways to find a health problem early. Early diagnosis and treatment give you the best chance of managing medical conditions that are common after age 58. Certain conditions and lifestyle choices may make you more likely to have a fall. Your health care provider may recommend:  Regular vision checks. Poor vision and conditions such as cataracts can make you more likely to have a fall. If you wear glasses, make sure to get your prescription updated if your vision changes.  Medicine review. Work with your health care provider to regularly review all of the medicines you are taking, including over-the-counter medicines. Ask your health care provider about any side effects that may make you more likely to have a fall. Tell your health care provider if any medicines that you take make you feel dizzy or sleepy.  Osteoporosis screening. Osteoporosis is a condition that causes the bones to get weaker. This can make the bones weak and cause them to break more easily.  Blood pressure screening. Blood pressure changes and medicines to control blood pressure can make you feel dizzy.  Strength and balance checks. Your health care provider may recommend certain tests to check your  strength and balance while standing, walking, or changing positions.  Foot health exam. Foot pain and numbness, as well as not wearing proper footwear, can make you more likely to have a fall.  Depression screening. You may be more likely to have a fall if you have a fear of falling, feel emotionally low, or feel unable to do activities that you used to do.  Alcohol use screening. Using too much alcohol can affect your balance and may make you more likely to have a fall. What actions can I take to lower my risk of falls? General instructions  Talk with your health care provider about your risks for falling. Tell your health care provider if: ? You fall. Be sure to tell your health care provider about all falls, even ones that seem minor. ? You feel dizzy, sleepy, or off-balance.  Take over-the-counter and prescription medicines only as told by your health care provider. These include any supplements.  Eat a healthy diet and maintain a healthy weight. A healthy diet includes low-fat dairy products, low-fat (lean) meats, and fiber from whole grains, beans, and lots of fruits and vegetables. Home safety  Remove any tripping hazards, such as rugs, cords, and clutter.  Install safety equipment such as grab bars in bathrooms and safety rails on stairs.  Keep rooms and walkways well-lit. Activity   Follow a regular exercise program to stay fit. This will help you maintain your balance. Ask your health care provider what types of exercise are appropriate for you.  If you need a cane or   walker, use it as recommended by your health care provider.  Wear supportive shoes that have nonskid soles. Lifestyle  Do not drink alcohol if your health care provider tells you not to drink.  If you drink alcohol, limit how much you have: ? 0-1 drink a day for women. ? 0-2 drinks a day for men.  Be aware of how much alcohol is in your drink. In the [place] one drink equals one typical bottle of beer (12  oz), one-half glass of wine (5 oz), or one shot of hard liquor (1 oz).  Do not use any products that contain nicotine or tobacco, such as cigarettes and e-cigarettes. If you need help quitting, ask your health care provider. Summary  Having a healthy lifestyle and getting preventive care can help to protect your health and wellness after age 58.  Screening and testing are the best way to find a health problem early and help you avoid having a fall. Early diagnosis and treatment give you the best chance for managing medical conditions that are more common for people who are older than age 58.  Falls are a major cause of broken bones and head injuries in people who are older than age 58. Take precautions to prevent a fall at home.  Work with your health care provider to learn what changes you can make to improve your health and wellness and to prevent falls. This information is not intended to replace advice given to you by your health care provider. Make sure you discuss any questions you have with your health care provider. Document Released: 06/22/2017 Document Revised: 11/30/2018 Document Reviewed: 06/22/2017 Elsevier Patient Education  2020 Elsevier Inc.  

## 2019-06-15 NOTE — Progress Notes (Signed)
You do have some mild arthritis(degenerative facet disease). Disc spaces look good. It doesn't look like hand strength changes are coming from neck but does not completely eliminate that possibility either. I would let Dr. Darene Lamer evaluate as your second opinion.

## 2019-06-15 NOTE — Progress Notes (Signed)
Subjective:    Patient ID: [facility], male    DOB: [birth year], 58 y.o.   MRN: [id number]  HPI  Pt is a 58 yo male who presents to the clinic for annual exam.   2 months ago had total right shoulder replacement. He is having a lot of pain and stiffness in right shoulder. He is also having numbness and tingling in right hand that is mostly in thumb, index, middle finger.  He denies any neck pain.   .. Active Ambulatory Problems    Diagnosis Date Noted  . NAUSEA 04/20/2009  . RUQ PAIN 04/20/2009  . GAD (generalized anxiety disorder) 02/03/2012  . MDD (major depressive disorder) 02/03/2012  . Hyperlipidemia 10/09/2014  . Burning pain 10/15/2014  . Numbness in both legs 10/15/2014  . Right leg pain 11/18/2014  . BPH without urinary obstruction 07/19/2016  . Enlarged prostate 07/19/2016  . Suicidal ideation 07/19/2016  . Acute right-sided low back pain with right-sided sciatica 09/30/2016  . SI (sacroiliac) joint inflammation (HCC) 09/30/2016  . Primary osteoarthritis of right shoulder 01/17/2018  . Right hand weakness 06/17/2019  . Numbness and tingling in right hand 06/17/2019   Resolved Ambulatory Problems    Diagnosis Date Noted  . Diarrhea 04/20/2009  . Cellulitis of right lower extremity 11/18/2014   Past Medical History:  Diagnosis Date  . Anxiety   . Depression   . Hyperlipemia   . Mood disorder (HCC)     Review of Systems  All other systems reviewed and are negative.      Objective:   Physical Exam  BP 115/83   Pulse 62   Ht 5\' 9"  (1.753 m)   Wt 197 lb (89.4 kg)   SpO2 99%   BMI 29.09 kg/m   General Appearance:    Alert, cooperative, no distress, appears stated age  Head:    Normocephalic, without obvious abnormality, atraumatic  Eyes:    PERRL, conjunctiva/corneas clear, EOM's intact, fundi    benign, both eyes       Ears:    Normal TM's and external ear canals, both ears  Nose:   Nares normal, septum midline, mucosa normal, no drainage    or  sinus tenderness  Throat:   Lips, mucosa, and tongue normal; teeth and gums normal  Neck:   Supple, symmetrical, trachea midline, no adenopathy;       thyroid:  No enlargement/tenderness/nodules; no carotid   bruit or JVD  Back:     Symmetric, no curvature, ROM normal, no CVA tenderness  Lungs:     Clear to auscultation bilaterally, respirations unlabored  Chest wall:    No tenderness or deformity  Heart:    Regular rate and rhythm, S1 and S2 normal, no murmur, rub   or gallop  Abdomen:     Soft, non-tender, bowel sounds active all four quadrants,    no masses, no organomegaly        Extremities:   Extremities normal, atraumatic, no cyanosis or edema. Hand grip 3/5 right hand. No phalens. No tinels.   Pulses:   2+ and symmetric all extremities  Skin:   Skin color, texture, turgor normal, no rashes or lesions  Lymph nodes:   Cervical, supraclavicular, and axillary nodes normal  Neurologic:   CNII-XII intact. Normal strength, sensation and reflexes      throughout       . Depression screen [facility] 2/9 06/15/2019 01/17/2018  Decreased Interest 0 2  Down, Depressed, Hopeless 0 1  PHQ - 2 Score 0 3  Altered sleeping 1 2  Tired, decreased energy 1 2  Change in appetite 0 2  Feeling bad or failure about yourself  0 0  Trouble concentrating 0 1  Moving slowly or fidgety/restless 0 1  Suicidal thoughts 0 0  PHQ-9 Score 2 11  Difficult doing work/chores Not difficult at all Not difficult at all    Assessment & Plan:  .Marland KitchenDeryl was seen today for follow-up.  Diagnoses and all orders for this visit:  Routine physical examination -     Lipid Panel w/reflex Direct LDL -     COMPLETE METABOLIC PANEL WITH GFR -     PSA -     CBC -     DG Cervical Spine Complete  Need for Tdap vaccination -     Tdap vaccine greater than or equal to 7yo IM  Screening for diabetes mellitus -     COMPLETE METABOLIC PANEL WITH GFR  Screening for lipid disorders -     Lipid Panel w/reflex Direct  LDL  Prostate cancer screening -     PSA  Numbness and tingling in right hand -     DG Cervical Spine Complete  Right hand weakness   .Marland KitchenStart a regular exercise program and make sure you are eating a healthy diet Try to eat 4 servings of dairy a day or take a calcium supplement (500mg  twice a day). Your vaccines are up to date. Tdap given today.  Pt declined flu shot.  Screening labs ordered.   Discussed shingrix. Declined today. HO given.   Will get cervical xray to look for any signs of neck involvement.

## 2019-06-16 LAB — CBC
HCT: 43.4 % (ref 38.5–50.0)
Hemoglobin: 14.8 g/dL (ref 13.2–17.1)
MCH: 30.6 pg (ref 27.0–33.0)
MCHC: 34.1 g/dL (ref 32.0–36.0)
MCV: 89.7 fL (ref 80.0–100.0)
MPV: 11.8 fL (ref 7.5–12.5)
Platelets: 163 10*3/uL (ref 140–400)
RBC: 4.84 10*6/uL (ref 4.20–5.80)
RDW: 12.5 % (ref 11.0–15.0)
WBC: 4.3 10*3/uL (ref 3.8–10.8)

## 2019-06-16 LAB — COMPLETE METABOLIC PANEL WITH GFR
AG Ratio: 1.8 (calc) (ref 1.0–2.5)
ALT: 15 U/L (ref 9–46)
AST: 16 U/L (ref 10–35)
Albumin: 4.6 g/dL (ref 3.6–5.1)
Alkaline phosphatase (APISO): 64 U/L (ref 35–144)
BUN: 23 mg/dL (ref 7–25)
CO2: 27 mmol/L (ref 20–32)
Calcium: 9.3 mg/dL (ref 8.6–10.3)
Chloride: 103 mmol/L (ref 98–110)
Creat: 0.95 mg/dL (ref 0.70–1.33)
GFR, Est African American: 102 mL/min/{1.73_m2} (ref >=60)
GFR, Est Non African American: 88 mL/min/{1.73_m2} (ref >=60)
Globulin: 2.5 g/dL (calc) (ref 1.9–3.7)
Glucose, Bld: 87 mg/dL (ref 65–99)
Potassium: 4.3 mmol/L (ref 3.5–5.3)
Sodium: 138 mmol/L (ref 135–146)
Total Bilirubin: 0.5 mg/dL (ref 0.2–1.2)
Total Protein: 7.1 g/dL (ref 6.1–8.1)

## 2019-06-16 LAB — PSA: PSA: 0.3 ng/mL (ref <=4.0)

## 2019-06-16 LAB — LIPID PANEL W/REFLEX DIRECT LDL
Cholesterol: 255 mg/dL — ABNORMAL HIGH (ref <200)
HDL: 52 mg/dL (ref >=40)
LDL Cholesterol (Calc): 181 mg/dL (calc) — ABNORMAL HIGH
Non-HDL Cholesterol (Calc): 203 mg/dL (calc) — ABNORMAL HIGH (ref <130)
Total CHOL/HDL Ratio: 4.9 (calc) (ref <5.0)
Triglycerides: 98 mg/dL (ref <150)

## 2019-06-17 ENCOUNTER — Encounter: Payer: Self-pay | Admitting: Physician Assistant

## 2019-06-17 DIAGNOSIS — R202 Paresthesia of skin: Secondary | ICD-10-CM | POA: Insufficient documentation

## 2019-06-17 DIAGNOSIS — R29898 Other symptoms and signs involving the musculoskeletal system: Secondary | ICD-10-CM | POA: Insufficient documentation

## 2019-06-17 DIAGNOSIS — R200 Anesthesia of skin: Secondary | ICD-10-CM | POA: Insufficient documentation

## 2019-06-18 DIAGNOSIS — M6281 Muscle weakness (generalized): Secondary | ICD-10-CM | POA: Diagnosis not present

## 2019-06-18 DIAGNOSIS — M25531 Pain in right wrist: Secondary | ICD-10-CM | POA: Diagnosis not present

## 2019-06-18 DIAGNOSIS — M25511 Pain in right shoulder: Secondary | ICD-10-CM | POA: Diagnosis not present

## 2019-06-18 NOTE — Progress Notes (Signed)
Call pt: PSA stable. Kidney, liver, glucose look great. Cholesterol increased a ton from last year. LDI is 181 from 91 last year. What happened? Your overall CV risk is 6.9 in 10 years. I would suggest cholesterol medication. What are your thoughts?

## 2019-06-19 MED ORDER — ATORVASTATIN CALCIUM 40 MG PO TABS: 40.0000 mg | ORAL_TABLET | Freq: Every day | ORAL | 3 refills | Status: DC

## 2019-06-19 NOTE — Progress Notes (Signed)
Done lipitor sent.

## 2019-06-19 NOTE — Addendum Note (Signed)
Addended by: Donella Stade on: 06/19/2019 06:23 AM   Modules accepted: Orders

## 2019-06-20 DIAGNOSIS — M25511 Pain in right shoulder: Secondary | ICD-10-CM | POA: Diagnosis not present

## 2019-06-20 DIAGNOSIS — M6281 Muscle weakness (generalized): Secondary | ICD-10-CM | POA: Diagnosis not present

## 2019-06-26 DIAGNOSIS — M25531 Pain in right wrist: Secondary | ICD-10-CM | POA: Diagnosis not present

## 2019-06-26 DIAGNOSIS — M6281 Muscle weakness (generalized): Secondary | ICD-10-CM | POA: Diagnosis not present

## 2019-06-26 DIAGNOSIS — M25511 Pain in right shoulder: Secondary | ICD-10-CM | POA: Diagnosis not present

## 2019-06-28 DIAGNOSIS — M25511 Pain in right shoulder: Secondary | ICD-10-CM | POA: Diagnosis not present

## 2019-06-28 DIAGNOSIS — M25531 Pain in right wrist: Secondary | ICD-10-CM | POA: Diagnosis not present

## 2019-07-02 DIAGNOSIS — M6281 Muscle weakness (generalized): Secondary | ICD-10-CM | POA: Diagnosis not present

## 2019-07-02 DIAGNOSIS — M25511 Pain in right shoulder: Secondary | ICD-10-CM | POA: Diagnosis not present

## 2019-07-04 DIAGNOSIS — M25511 Pain in right shoulder: Secondary | ICD-10-CM | POA: Diagnosis not present

## 2019-07-04 DIAGNOSIS — M6281 Muscle weakness (generalized): Secondary | ICD-10-CM | POA: Diagnosis not present

## 2019-07-04 DIAGNOSIS — M25531 Pain in right wrist: Secondary | ICD-10-CM | POA: Diagnosis not present

## 2019-07-26 DIAGNOSIS — M25531 Pain in right wrist: Secondary | ICD-10-CM | POA: Diagnosis not present

## 2019-07-26 DIAGNOSIS — M25511 Pain in right shoulder: Secondary | ICD-10-CM | POA: Diagnosis not present

## 2019-07-26 DIAGNOSIS — M6281 Muscle weakness (generalized): Secondary | ICD-10-CM | POA: Diagnosis not present

## 2019-07-31 DIAGNOSIS — M25531 Pain in right wrist: Secondary | ICD-10-CM | POA: Diagnosis not present

## 2019-07-31 DIAGNOSIS — M6281 Muscle weakness (generalized): Secondary | ICD-10-CM | POA: Diagnosis not present

## 2019-07-31 DIAGNOSIS — M25511 Pain in right shoulder: Secondary | ICD-10-CM | POA: Diagnosis not present

## 2019-08-02 DIAGNOSIS — M25531 Pain in right wrist: Secondary | ICD-10-CM | POA: Diagnosis not present

## 2019-08-02 DIAGNOSIS — M25511 Pain in right shoulder: Secondary | ICD-10-CM | POA: Diagnosis not present

## 2019-08-07 DIAGNOSIS — M25511 Pain in right shoulder: Secondary | ICD-10-CM | POA: Diagnosis not present

## 2019-08-07 DIAGNOSIS — M6281 Muscle weakness (generalized): Secondary | ICD-10-CM | POA: Diagnosis not present

## 2019-08-09 DIAGNOSIS — G5601 Carpal tunnel syndrome, right upper limb: Secondary | ICD-10-CM | POA: Diagnosis not present

## 2019-08-14 DIAGNOSIS — M6281 Muscle weakness (generalized): Secondary | ICD-10-CM | POA: Diagnosis not present

## 2019-08-14 DIAGNOSIS — M25511 Pain in right shoulder: Secondary | ICD-10-CM | POA: Diagnosis not present

## 2019-08-21 DIAGNOSIS — M25511 Pain in right shoulder: Secondary | ICD-10-CM | POA: Diagnosis not present

## 2019-08-22 DIAGNOSIS — M25511 Pain in right shoulder: Secondary | ICD-10-CM | POA: Diagnosis not present

## 2019-08-22 DIAGNOSIS — M6281 Muscle weakness (generalized): Secondary | ICD-10-CM | POA: Diagnosis not present

## 2019-08-22 DIAGNOSIS — M25531 Pain in right wrist: Secondary | ICD-10-CM | POA: Diagnosis not present

## 2019-08-26 ENCOUNTER — Encounter: Payer: Self-pay | Admitting: Physician Assistant

## 2019-08-27 NOTE — Telephone Encounter (Signed)
Hey Dom can you help me get sound on this? I can pull up video.

## 2019-08-27 NOTE — Telephone Encounter (Signed)
Possible issue with the media player, we only have access to [employer] player. This tends to cut sound or video out due to blocks from IT

## 2019-08-28 DIAGNOSIS — M6281 Muscle weakness (generalized): Secondary | ICD-10-CM | POA: Diagnosis not present

## 2019-08-28 DIAGNOSIS — M25511 Pain in right shoulder: Secondary | ICD-10-CM | POA: Diagnosis not present

## 2019-08-28 DIAGNOSIS — M25531 Pain in right wrist: Secondary | ICD-10-CM | POA: Diagnosis not present

## 2019-08-30 DIAGNOSIS — M6281 Muscle weakness (generalized): Secondary | ICD-10-CM | POA: Diagnosis not present

## 2019-08-30 DIAGNOSIS — M25511 Pain in right shoulder: Secondary | ICD-10-CM | POA: Diagnosis not present

## 2019-08-30 DIAGNOSIS — M25531 Pain in right wrist: Secondary | ICD-10-CM | POA: Diagnosis not present

## 2019-09-10 DIAGNOSIS — F331 Major depressive disorder, recurrent, moderate: Secondary | ICD-10-CM | POA: Diagnosis not present

## 2019-09-10 DIAGNOSIS — F411 Generalized anxiety disorder: Secondary | ICD-10-CM | POA: Diagnosis not present

## 2019-09-17 ENCOUNTER — Other Ambulatory Visit: Payer: Self-pay

## 2019-09-17 ENCOUNTER — Emergency Department
Admission: EM | Admit: 2019-09-17 | Discharge: 2019-09-17 | Disposition: A | Discharge status: Home / Self Care | Payer: BC Managed Care – PPO | Source: Home / Self Care | Attending: Family Medicine | Admitting: Family Medicine

## 2019-09-17 DIAGNOSIS — S61411A Laceration without foreign body of right hand, initial encounter: Secondary | ICD-10-CM

## 2019-09-17 NOTE — ED Provider Notes (Signed)
[facility]    CSN: [id number] Arrival date & time: 09/17/19  1214      History   Chief Complaint Chief Complaint  Patient presents with  . Laceration    HPI [facility] is a 59 y.o. male.   Patient lacerated the dorsum of his right hand on a sharp metal edge about one hour ago.  His last Tdap was 2 months ago.  The history is provided by the patient.  Laceration Location:  Hand Hand laceration location:  Dorsum of R hand Length:  1.5cm Depth:  Through dermis Quality: straight   Bleeding: controlled   Time since incident:  1 hour Laceration mechanism:  Metal edge Pain details:    Quality:  Aching   Severity:  Mild   Timing:  Constant   Progression:  Improving Foreign body present:  No foreign bodies Ineffective treatments:  None tried Tetanus status:  Up to date Associated symptoms: no numbness and no swelling     Past Medical History:  Diagnosis Date  . Anxiety   . Depression   . Hyperlipemia   . Hyperlipidemia   . Mood disorder [facility])     Patient Active Problem List   Diagnosis Date Noted  . Right hand weakness 06/17/2019  . Numbness and tingling in right hand 06/17/2019  . Primary osteoarthritis of right shoulder 01/17/2018  . Acute right-sided low back pain with right-sided sciatica 09/30/2016  . SI (sacroiliac) joint inflammation ([place]) 09/30/2016  . BPH without urinary obstruction 07/19/2016  . Enlarged prostate 07/19/2016  . Suicidal ideation 07/19/2016  . Right leg pain 11/18/2014  . Burning pain 10/15/2014  . Numbness in both legs 10/15/2014  . Hyperlipidemia 10/09/2014  . GAD (generalized anxiety disorder) 02/03/2012  . MDD (major depressive disorder) 02/03/2012  . NAUSEA 04/20/2009  . RUQ PAIN 04/20/2009    Past Surgical History:  Procedure Laterality Date  . left shoulder surgery    . SHOULDER SURGERY         Home Medications    Prior to Admission medications   Medication Sig Start Date End Date Taking?  Authorizing Provider  atorvastatin (LIPITOR) 40 MG tablet Take 1 tablet (40 mg total) by mouth daily. 06/19/19   Breeback, Jade L, PA-C  sertraline (ZOLOFT) 100 MG tablet TAKE 1 TABLET EVERY DAY 11/30/13   Puthuvel, Thompson Grayer, MD  escitalopram (LEXAPRO) 10 MG tablet Take 10 mg by mouth daily.  11/01/11  [provider]    Family History Family History  Problem Relation Age of Onset  . Bipolar disorder Mother   . Heart failure Mother   . Hypertension Mother   . Cancer Father        throat, prostate  . Hypertension Father   . Hyperlipidemia Father     Social History Social History   Tobacco Use  . Smoking status: Never Smoker  . Smokeless tobacco: Never Used  Substance Use Topics  . Alcohol use: Yes  . Drug use: No     Allergies   Lyrica [pregabalin]   Review of Systems Review of Systems  All other systems reviewed and are negative.    Physical Exam Triage Vital Signs ED Triage Vitals  Enc Vitals Group     BP 09/17/19 1242 133/89     Pulse Rate 09/17/19 1242 67     Resp 09/17/19 1242 20     Temp 09/17/19 1242 98.9 F (37.2 C)     Temp Source 09/17/19 1242 Oral  SpO2 09/17/19 1242 98 %     Weight 09/17/19 1239 195 lb (88.5 kg)     Height 09/17/19 1239 5\' 9"  (1.753 m)     Head Circumference --      Peak Flow --      Pain Score 09/17/19 1238 6     Pain Loc --      Pain Edu? --      Excl. in [place]? --    No data found.  Updated Vital Signs BP 133/89 (BP Location: Left Arm)   Pulse 67   Temp 98.9 F (37.2 C) (Oral)   Resp 20   Ht 5\' 9"  (1.753 m)   Wt 88.5 kg   SpO2 98%   BMI 28.80 kg/m   Visual Acuity Right Eye Distance:   Left Eye Distance:   Bilateral Distance:    Right Eye Near:   Left Eye Near:    Bilateral Near:     Physical Exam Constitutional:      General: He is not in acute distress. HENT:     Head: Atraumatic.  Eyes:     Pupils: Pupils are equal, round, and reactive to light.  Cardiovascular:     Rate and Rhythm:  Normal rate.  Pulmonary:     Effort: Pulmonary effort is normal.  Musculoskeletal:     Right hand: Laceration present. No swelling. Normal range of motion. Normal sensation.       Hands:     Comments: Dorsum of right hand has a simple linear laceration as noted on diagram.  All fingers have full range of motion.  Distal neurovascular function is intact.   Skin:    General: Skin is warm and dry.  Neurological:     Mental Status: He is alert.      UC Treatments / Results  Labs (all labs ordered are listed, but only abnormal results are displayed) Labs Reviewed - No data to display  EKG   Radiology No results found.  Procedures Procedures  Laceration Repair Discussed benefits and risks of procedure and verbal consent obtained. Using sterile technique and local anesthesia with 1% lidocaine with epinephrine, cleansed wound with Betadine followed by copious lavage with normal saline.  Wound carefully inspected for debris and foreign bodies; none found.  Wound closed with #4, 4-0 interrupted nylon sutures.  Bacitracin and non-stick sterile dressing applied.  Wound precautions explained to patient.  Return for suture removal in 10 days.   Medications Ordered in UC Medications - No data to display  Initial Impression / Assessment and Plan / UC Course  I have reviewed the triage vital signs and the nursing notes.  Pertinent labs & imaging results that were available during my care of the patient were reviewed by me and considered in my medical decision making (see chart for details).       Final Clinical Impressions(s) / UC Diagnoses   Final diagnoses:  Laceration of right hand without foreign body, initial encounter     Discharge Instructions     Change dressing daily and apply Bacitracin ointment to wound.  Keep wound clean and dry.  Return for any signs of infection (or follow-up with family doctor):  Increasing redness, swelling, pain, heat, drainage, etc. Return in 10  days for suture removal.      ED Prescriptions    None        09/19/19, MD 09/17/19 1401

## 2019-09-17 NOTE — ED Triage Notes (Signed)
Cut right hand on a piece of metal at work.  Cut approx. 1 " long on top of hand.  Pt states his tetanus was 2 months ago.

## 2019-09-17 NOTE — Discharge Instructions (Addendum)
Change dressing daily and apply Bacitracin ointment to wound.  Keep wound clean and dry.  Return for any signs of infection (or follow-up with family doctor):  Increasing redness, swelling, pain, heat, drainage, etc. °Return in 10 days for suture removal.   °

## 2019-09-25 DIAGNOSIS — M25531 Pain in right wrist: Secondary | ICD-10-CM | POA: Diagnosis not present

## 2019-10-10 DIAGNOSIS — M25531 Pain in right wrist: Secondary | ICD-10-CM | POA: Diagnosis not present

## 2019-10-10 DIAGNOSIS — G5602 Carpal tunnel syndrome, left upper limb: Secondary | ICD-10-CM | POA: Diagnosis not present

## 2019-10-15 DIAGNOSIS — G5601 Carpal tunnel syndrome, right upper limb: Secondary | ICD-10-CM | POA: Diagnosis not present

## 2019-10-15 DIAGNOSIS — G5621 Lesion of ulnar nerve, right upper limb: Secondary | ICD-10-CM | POA: Diagnosis not present

## 2019-10-22 DIAGNOSIS — M25531 Pain in right wrist: Secondary | ICD-10-CM | POA: Diagnosis not present

## 2019-11-06 DIAGNOSIS — M25531 Pain in right wrist: Secondary | ICD-10-CM | POA: Diagnosis not present

## 2019-12-04 ENCOUNTER — Telehealth: Payer: Self-pay

## 2019-12-04 DIAGNOSIS — M79644 Pain in right finger(s): Secondary | ICD-10-CM | POA: Diagnosis not present

## 2019-12-04 DIAGNOSIS — M25531 Pain in right wrist: Secondary | ICD-10-CM | POA: Diagnosis not present

## 2019-12-04 DIAGNOSIS — M19041 Primary osteoarthritis, right hand: Secondary | ICD-10-CM | POA: Diagnosis not present

## 2019-12-04 DIAGNOSIS — F411 Generalized anxiety disorder: Secondary | ICD-10-CM | POA: Diagnosis not present

## 2019-12-04 DIAGNOSIS — F331 Major depressive disorder, recurrent, moderate: Secondary | ICD-10-CM | POA: Diagnosis not present

## 2019-12-04 NOTE — Telephone Encounter (Signed)
Yes that is fine. Does he need refills right now? Ok to given him 90 days and get in for appt with me to document take over.

## 2019-12-04 NOTE — Telephone Encounter (Signed)
Dr Corrie Dandy Moore's office called and state she is decreasing her patient load. She wanted to know if [name] would take over his care. He is currently well controlled on his medications.   Zoloft 100 mg 2 tablets daily Xanax 0.5 mg 1 daily as needed #10 - 30 day supply Trazodone 50 mg at bedtime nightly and/or as needed # 90 for 90 day supply   [phone]

## 2019-12-05 NOTE — Telephone Encounter (Signed)
Dr Kathi Der office advised. He just had refills on his medication. They will instruct patient to follow up with [facility] before the need of next refill.

## 2020-02-27 ENCOUNTER — Encounter: Payer: Self-pay | Admitting: Physician Assistant

## 2020-03-04 DIAGNOSIS — M25531 Pain in right wrist: Secondary | ICD-10-CM | POA: Diagnosis not present

## 2020-03-12 ENCOUNTER — Other Ambulatory Visit: Payer: Self-pay

## 2020-03-12 ENCOUNTER — Ambulatory Visit (INDEPENDENT_AMBULATORY_CARE_PROVIDER_SITE_OTHER): Payer: BC Managed Care – PPO | Admitting: Physician Assistant

## 2020-03-12 ENCOUNTER — Encounter: Payer: Self-pay | Admitting: Physician Assistant

## 2020-03-12 VITALS — BP 121/81 | HR 65 | Ht 69.0 in | Wt 213.0 lb

## 2020-03-12 DIAGNOSIS — S60511A Abrasion of right hand, initial encounter: Secondary | ICD-10-CM | POA: Diagnosis not present

## 2020-03-12 DIAGNOSIS — S60419A Abrasion of unspecified finger, initial encounter: Secondary | ICD-10-CM

## 2020-03-12 DIAGNOSIS — M795 Residual foreign body in soft tissue: Secondary | ICD-10-CM | POA: Diagnosis not present

## 2020-03-12 MED ORDER — DOXYCYCLINE HYCLATE 100 MG PO TABS: 100.0000 mg | ORAL_TABLET | Freq: Two times a day (BID) | ORAL | 0 refills | Status: DC

## 2020-03-12 NOTE — Patient Instructions (Signed)
Start antibiotic. Salt water soaks after 12 hours.

## 2020-03-12 NOTE — Progress Notes (Signed)
   Subjective:    Patient ID: Shawn Lloyd, male    DOB: 08-Aug-1961, 59 y.o.   MRN: [id number]  HPI  Patient is a 59 year old male who presents to the clinic after patient was working in his shop and a device exploded and rest fragments abraded his right hand.  He denies any significant pain.  He denies any fever, chills.  He has washed his hands but done nothing else.  This happened yesterday afternoon.    .. Active Ambulatory Problems    Diagnosis Date Noted  . NAUSEA 04/20/2009  . RUQ PAIN 04/20/2009  . GAD (generalized anxiety disorder) 02/03/2012  . MDD (major depressive disorder) 02/03/2012  . Hyperlipidemia 10/09/2014  . Burning pain 10/15/2014  . Numbness in both legs 10/15/2014  . Right leg pain 11/18/2014  . BPH without urinary obstruction 07/19/2016  . Enlarged prostate 07/19/2016  . Suicidal ideation 07/19/2016  . Acute right-sided low back pain with right-sided sciatica 09/30/2016  . SI (sacroiliac) joint inflammation (HCC) 09/30/2016  . Primary osteoarthritis of right shoulder 01/17/2018  . Right hand weakness 06/17/2019  . Numbness and tingling in right hand 06/17/2019  . Abrasion of multiple sites of right hand and finger 03/14/2020   Resolved Ambulatory Problems    Diagnosis Date Noted  . Diarrhea 04/20/2009  . Cellulitis of right lower extremity 11/18/2014   Past Medical History:  Diagnosis Date  . Anxiety   . Depression   . Hyperlipemia   . Mood disorder (HCC)     Review of Systems See HPI.     Objective:   Physical Exam Vitals reviewed.  Constitutional:      Appearance: Normal appearance.  Skin:    Comments: Right hand: Multiple superficial abrasions with the worst around cuticle of index finger. Some rust fragments seen in skin.  Slightly swollen.  No warmth at this time.   Neurological:     General: No focal deficit present.     Mental Status: He is alert and oriented to person, place, and time.  Psychiatric:        Mood and Affect:  Mood normal.           Assessment & Plan:  .Marland KitchenRose was seen today for trauma.  Diagnoses and all orders for this visit:  Abrasion of multiple sites of right hand and finger, initial encounter -     doxycycline (VIBRA-TABS) 100 MG tablet; Take 1 tablet (100 mg total) by mouth 2 (two) times daily.  Foreign body (FB) in soft tissue -     doxycycline (VIBRA-TABS) 100 MG tablet; Take 1 tablet (100 mg total) by mouth 2 (two) times daily.   Cleaned with hibiclens. Removed some rust fragments with tweezers. Given doxycycline. Tdap UTD. Wrapped with gauzed and abx ointment. Keep covered for 12 hours then do epson salt water soaks. Keep dry and clean. Follow up as needed.

## 2020-03-14 ENCOUNTER — Encounter: Payer: Self-pay | Admitting: Physician Assistant

## 2020-03-14 DIAGNOSIS — S60419A Abrasion of unspecified finger, initial encounter: Secondary | ICD-10-CM | POA: Insufficient documentation

## 2020-04-15 ENCOUNTER — Encounter: Payer: Self-pay | Admitting: Physician Assistant

## 2020-05-26 ENCOUNTER — Other Ambulatory Visit: Payer: Self-pay | Admitting: Physician Assistant

## 2020-05-27 ENCOUNTER — Other Ambulatory Visit: Payer: Self-pay | Admitting: Physician Assistant

## 2020-07-02 ENCOUNTER — Telehealth: Payer: Self-pay

## 2020-07-02 MED ORDER — SERTRALINE HCL 100 MG PO TABS: 200.0000 mg | ORAL_TABLET | Freq: Every day | ORAL | 0 refills | Status: DC

## 2020-07-02 NOTE — Telephone Encounter (Signed)
Ok sent refills.

## 2020-07-02 NOTE — Telephone Encounter (Signed)
Shawn Lloyd is ready for a refill on Sertraline. He takes 100 mg two tablets once daily. He is not longer with Dr Kathi Der office. He has been scheduled for a follow up in the next 2 weeks.   December 04, 2019 Shawn Lloyd, [place] to Me     4:46 PM Note Yes that is fine. Does he need refills right now? Ok to given him 90 days and get in for appt with me to document take over.      Me     2:42 PM Note Dr Corrie Dandy [facility] office called and state she is decreasing her patient load. She wanted to know if [name] would take over his care. He is currently well controlled on his medications.   Zoloft 100 mg 2 tablets daily Xanax 0.5 mg 1 daily as needed #10 - 30 day supply Trazodone 50 mg at bedtime nightly and/or as needed # 90 for 90 day supply   [phone]

## 2020-07-03 ENCOUNTER — Ambulatory Visit (INDEPENDENT_AMBULATORY_CARE_PROVIDER_SITE_OTHER): Payer: BC Managed Care – PPO | Admitting: Sports Medicine

## 2020-07-03 ENCOUNTER — Emergency Department (HOSPITAL_BASED_OUTPATIENT_CLINIC_OR_DEPARTMENT_OTHER)
Admission: EM | Admit: 2020-07-03 | Discharge: 2020-07-03 | Disposition: A | Discharge status: Home / Self Care | Payer: BC Managed Care – PPO | Attending: Emergency Medicine | Admitting: Emergency Medicine

## 2020-07-03 ENCOUNTER — Encounter (HOSPITAL_BASED_OUTPATIENT_CLINIC_OR_DEPARTMENT_OTHER): Payer: Self-pay | Admitting: *Deleted

## 2020-07-03 ENCOUNTER — Encounter (HOSPITAL_BASED_OUTPATIENT_CLINIC_OR_DEPARTMENT_OTHER): Payer: Self-pay

## 2020-07-03 ENCOUNTER — Other Ambulatory Visit: Payer: Self-pay

## 2020-07-03 ENCOUNTER — Encounter: Payer: Self-pay | Admitting: Sports Medicine

## 2020-07-03 ENCOUNTER — Emergency Department (HOSPITAL_BASED_OUTPATIENT_CLINIC_OR_DEPARTMENT_OTHER): Payer: BC Managed Care – PPO

## 2020-07-03 ENCOUNTER — Other Ambulatory Visit: Payer: Self-pay | Admitting: Family Medicine

## 2020-07-03 ENCOUNTER — Ambulatory Visit (INDEPENDENT_AMBULATORY_CARE_PROVIDER_SITE_OTHER): Payer: BC Managed Care – PPO

## 2020-07-03 ENCOUNTER — Ambulatory Visit (HOSPITAL_BASED_OUTPATIENT_CLINIC_OR_DEPARTMENT_OTHER)
Admission: RE | Admit: 2020-07-03 | Discharge: 2020-07-03 | Disposition: A | Discharge status: Home / Self Care | Payer: BC Managed Care – PPO | Source: Ambulatory Visit | Attending: Family Medicine | Admitting: Family Medicine

## 2020-07-03 DIAGNOSIS — R7989 Other specified abnormal findings of blood chemistry: Secondary | ICD-10-CM

## 2020-07-03 DIAGNOSIS — I10 Essential (primary) hypertension: Secondary | ICD-10-CM | POA: Diagnosis not present

## 2020-07-03 DIAGNOSIS — M47812 Spondylosis without myelopathy or radiculopathy, cervical region: Secondary | ICD-10-CM | POA: Diagnosis not present

## 2020-07-03 DIAGNOSIS — R0789 Other chest pain: Secondary | ICD-10-CM

## 2020-07-03 DIAGNOSIS — R0602 Shortness of breath: Secondary | ICD-10-CM | POA: Diagnosis not present

## 2020-07-03 DIAGNOSIS — M5030 Other cervical disc degeneration, unspecified cervical region: Secondary | ICD-10-CM

## 2020-07-03 DIAGNOSIS — M4312 Spondylolisthesis, cervical region: Secondary | ICD-10-CM | POA: Diagnosis not present

## 2020-07-03 DIAGNOSIS — M4802 Spinal stenosis, cervical region: Secondary | ICD-10-CM | POA: Diagnosis not present

## 2020-07-03 DIAGNOSIS — R079 Chest pain, unspecified: Secondary | ICD-10-CM

## 2020-07-03 LAB — TROPONIN I (HIGH SENSITIVITY): Troponin I (High Sensitivity): 4 ng/L (ref <18)

## 2020-07-03 LAB — BRAIN NATRIURETIC PEPTIDE: B Natriuretic Peptide: 30.7 pg/mL (ref 0.0–100.0)

## 2020-07-03 LAB — BASIC METABOLIC PANEL
Anion gap: 9 (ref 5–15)
BUN: 16 mg/dL (ref 6–20)
CO2: 24 mmol/L (ref 22–32)
Calcium: 8.8 mg/dL — ABNORMAL LOW (ref 8.9–10.3)
Chloride: 107 mmol/L (ref 98–111)
Creatinine, Ser: 0.72 mg/dL (ref 0.61–1.24)
GFR, Estimated: >60 mL/min (ref >60)
Glucose, Bld: 133 mg/dL — ABNORMAL HIGH (ref 70–99)
Potassium: 4 mmol/L (ref 3.5–5.1)
Sodium: 140 mmol/L (ref 135–145)

## 2020-07-03 MED ORDER — PREDNISONE 50 MG PO TABS: ORAL_TABLET | ORAL | 0 refills | Status: DC

## 2020-07-03 MED ORDER — IOHEXOL 350 MG/ML SOLN
100.0000 mL | Freq: Once | INTRAVENOUS | Status: AC | PRN
  Administered 2020-07-03: 100 mL via INTRAVENOUS

## 2020-07-03 NOTE — Assessment & Plan Note (Signed)
Shawn Lloyd has an x-ray from about a year ago that shows multilevel cervical DDD, he has been having pain between the shoulder blades with occasional radiation and tightness into the upper chest. I think the predominance of his symptoms are related to his cervical processes, adding 5 days of prednisone, repeat x-rays, home physical therapy, return to see me in 4 to 6 weeks, MRI for interventional planning if no better.

## 2020-07-03 NOTE — Progress Notes (Signed)
    Procedures performed today:    None.  Independent interpretation of notes and tests performed by another provider:   None.  Brief History, Exam, Impression, and Recommendations:    DDD (degenerative disc disease), cervical Shawn Lloyd has an x-ray from about a year ago that shows multilevel cervical DDD, he has been having pain between the shoulder blades with occasional radiation and tightness into the upper chest. I think the predominance of his symptoms are related to his cervical processes, adding 5 days of prednisone, repeat x-rays, home physical therapy, return to see me in 4 to 6 weeks, MRI for interventional planning if no better.  Chest tightness Shawn Lloyd is also been complaining of upper chest tightness, with slight pleuritic discomfort along with his periscapular pain. He gets a stress test, with ECG and weighted vest every year, the last was done last year and it was clear. He also had a twelve-lead ECG at rest that was normal with the exception of mild sinus arrhythmia. Locally I think the above is sufficient to conclude that he is not having an anginal process. I am going to get a CBC, CMP, D-dimer as well as a chest x-ray considering the pleuritic component of his pain.     ___________________________________________ Ihor Austin. Benjamin Stain, M.D., ABFM., CAQSM. Primary Care and Sports Medicine [facility]  Adjunct Instructor of Family Medicine  [employer]

## 2020-07-03 NOTE — Discharge Instructions (Signed)
Follow up with your family doc in the office.  Return for worsening pain, especially on exertion.

## 2020-07-03 NOTE — Assessment & Plan Note (Signed)
Shawn Lloyd is also been complaining of upper chest tightness, with slight pleuritic discomfort along with his periscapular pain. He gets a stress test, with ECG and weighted vest every year, the last was done last year and it was clear. He also had a twelve-lead ECG at rest that was normal with the exception of mild sinus arrhythmia. Locally I think the above is sufficient to conclude that he is not having an anginal process. I am going to get a CBC, CMP, D-dimer as well as a chest x-ray considering the pleuritic component of his pain.

## 2020-07-03 NOTE — ED Triage Notes (Signed)
4 weeks of tightness across his chest on and off. He was seen by his MD today and had blood work that showed an elevated DDimer. He had a CXR done today at [facility]. He was sent here due to DDimer. No SOB but feels his chest does not expand completely.

## 2020-07-03 NOTE — ED Provider Notes (Signed)
[facility] EMERGENCY DEPARTMENT Provider Note   CSN: [id number] Arrival date & time: 07/03/20  1727     History No chief complaint on file.   ARTHA CHIASSON is a 59 y.o. male.  59 yo M with a chief complaint of chest pain.  He feels like there is a band wrapped around it hurts with deep breathing twisting and turning.  He saw his family doctor because been going on for about a month now.  Denies trauma denies cough congestion or fever denies hemoptysis denies unilateral lower extremity edema denies recent surgery immobilization or hospitalization.  Denies history of PE or DVT.  There is some concern for PE at his family doctor's office and so D-dimer was obtained.  It was elevated he was sent here for evaluation.  He denies any worsening with eating.  Denies abdominal pain.  The history is provided by the patient.  Chest Pain Pain location:  Substernal area Pain quality: aching   Pain radiates to:  Does not radiate Pain severity:  Moderate Onset quality:  Gradual Duration:  4 weeks Timing:  Intermittent Progression:  Waxing and waning Chronicity:  New Relieved by:  Nothing Worsened by:  Deep breathing, certain positions and movement Ineffective treatments:  None tried Associated symptoms: shortness of breath   Associated symptoms: no abdominal pain, no fever, no headache, no palpitations and no vomiting        Past Medical History:  Diagnosis Date  . Anxiety   . Depression   . Hyperlipemia   . Hyperlipidemia   . Mood disorder [facility])     Patient Active Problem List   Diagnosis Date Noted  . DDD (degenerative disc disease), cervical 07/03/2020  . Chest tightness 07/03/2020  . Abrasion of multiple sites of right hand and finger 03/14/2020  . Right hand weakness 06/17/2019  . Numbness and tingling in right hand 06/17/2019  . Primary osteoarthritis of right shoulder 01/17/2018  . Acute right-sided low back pain with right-sided sciatica 09/30/2016  . SI  (sacroiliac) joint inflammation (HCC) 09/30/2016  . BPH without urinary obstruction 07/19/2016  . Enlarged prostate 07/19/2016  . Suicidal ideation 07/19/2016  . Right leg pain 11/18/2014  . Burning pain 10/15/2014  . Numbness in both legs 10/15/2014  . Hyperlipidemia 10/09/2014  . GAD (generalized anxiety disorder) 02/03/2012  . MDD (major depressive disorder) 02/03/2012  . NAUSEA 04/20/2009  . RUQ PAIN 04/20/2009    Past Surgical History:  Procedure Laterality Date  . left shoulder surgery    . SHOULDER SURGERY         Family History  Problem Relation Age of Onset  . Bipolar disorder Mother   . Heart failure Mother   . Hypertension Mother   . Cancer Father        throat, prostate  . Hypertension Father   . Hyperlipidemia Father     Social History   Tobacco Use  . Smoking status: Never Smoker  . Smokeless tobacco: Never Used  Vaping Use  . Vaping Use: Never used  Substance Use Topics  . Alcohol use: Yes  . Drug use: No    Home Medications Prior to Admission medications   Medication Sig Start Date End Date Taking? Authorizing Provider  ALPRAZolam Prudy Feeler) 0.5 MG tablet Take 0.5 mg by mouth daily as needed. 12/04/19   [provider]  atorvastatin (LIPITOR) 40 MG tablet Take 1 tablet (40 mg total) by mouth daily. Needs labs/appt 05/27/20   Jomarie Longs, PA-C  predniSONE (DELTASONE) 50 MG tablet One tab PO daily for 5 days. 07/03/20   Monica Becton, MD  sertraline (ZOLOFT) 100 MG tablet Take 2 tablets (200 mg total) by mouth daily. 07/02/20   Breeback, Jade L, PA-C  traZODone (DESYREL) 50 MG tablet Take 50 mg by mouth at bedtime. 02/06/18   [provider]    Allergies    Lyrica [pregabalin]  Review of Systems   Review of Systems  Constitutional: Negative for chills and fever.  HENT: Negative for congestion and facial swelling.   Eyes: Negative for discharge and visual disturbance.  Respiratory: Positive for shortness of breath.    Cardiovascular: Positive for chest pain. Negative for palpitations.  Gastrointestinal: Negative for abdominal pain, diarrhea and vomiting.  Musculoskeletal: Negative for arthralgias and myalgias.  Skin: Negative for color change and rash.  Neurological: Negative for tremors, syncope and headaches.  Psychiatric/Behavioral: Negative for confusion and dysphoric mood.    Physical Exam Updated Vital Signs BP 132/88 (BP Location: Right Arm)   Pulse (!) 58   Temp 97.9 F (36.6 C) (Oral)   Resp 10   Ht 5\' 9"  (1.753 m)   Wt 98 kg   SpO2 99%   BMI 31.91 kg/m   Physical Exam Vitals and nursing note reviewed.  Constitutional:      Appearance: He is well-developed.  HENT:     Head: Normocephalic and atraumatic.  Eyes:     Pupils: Pupils are equal, round, and reactive to light.  Neck:     Vascular: No JVD.  Cardiovascular:     Rate and Rhythm: Normal rate and regular rhythm.     Heart sounds: No murmur heard.  No friction rub. No gallop.   Pulmonary:     Effort: No respiratory distress.     Breath sounds: No wheezing.  Abdominal:     General: There is no distension.     Tenderness: There is no abdominal tenderness. There is no guarding or rebound.  Musculoskeletal:        General: Normal range of motion.     Cervical back: Normal range of motion and neck supple.  Skin:    Coloration: Skin is not pale.     Findings: No rash.  Neurological:     Mental Status: He is alert and oriented to person, place, and time.  Psychiatric:        Behavior: Behavior normal.     ED Results / Procedures / Treatments   Labs (all labs ordered are listed, but only abnormal results are displayed) Labs Reviewed  BASIC METABOLIC PANEL - Abnormal; Notable for the following components:      Result Value   Glucose, Bld 133 (*)    Calcium 8.8 (*)    All other components within normal limits  BRAIN NATRIURETIC PEPTIDE  TROPONIN I (HIGH SENSITIVITY)    EKG None  Radiology DG Chest 2  View  Result Date: 07/03/2020 CLINICAL DATA:  Pleuritic chest discomfort EXAM: CHEST - 2 VIEW COMPARISON:  05/14/2013 chest radiograph. FINDINGS: Bilateral shoulder arthroplasty partially visualized. Stable cardiomediastinal silhouette with normal heart size. No pneumothorax. No pleural effusion. Lungs appear clear, with no acute consolidative airspace disease and no pulmonary edema. IMPRESSION: No active cardiopulmonary disease. Electronically Signed   By: 05/16/2013 M.D.   On: 07/03/2020 16:54   DG Cervical Spine Complete  Result Date: 07/03/2020 CLINICAL DATA:  Chest tightness and pain between the shoulder blades for 3-4 weeks. EXAM: CERVICAL SPINE - COMPLETE  4+ VIEW COMPARISON:  06/15/2019 FINDINGS: There is trace anterolisthesis C6 on C7. There is minimal endplate spurring, annular calcification, and at most minimal disc space narrowing at C5-6 and C6-7, unchanged. No significant osseous neural foraminal stenosis is evident. No fracture is identified. The prevertebral soft tissues are within normal limits. Prior shoulder arthroplasty is noted. IMPRESSION: Mild spondylosis without evidence of acute osseous abnormality. Electronically Signed   By: Sebastian Ache M.D.   On: 07/03/2020 17:06   CT Angio Chest PE W and/or Wo Contrast  Result Date: 07/03/2020 CLINICAL DATA:  Intermittent chest tightness for 4 weeks, elevated D dimer EXAM: CT ANGIOGRAPHY CHEST WITH CONTRAST TECHNIQUE: Multidetector CT imaging of the chest was performed using the standard protocol during bolus administration of intravenous contrast. Multiplanar CT image reconstructions and MIPs were obtained to evaluate the vascular anatomy. CONTRAST:  OMNIPAQUE IOHEXOL 350 MG/ML SOLN COMPARISON:  07/03/2020 FINDINGS: Cardiovascular: This is a technically adequate evaluation of the pulmonary vasculature. There are no filling defects or pulmonary emboli. The heart is unremarkable without pericardial effusion. Mild atherosclerosis  throughout the LAD distribution of the coronary vasculature. Normal caliber of the thoracic aorta with no evidence of dissection. Mediastinum/Nodes: No enlarged mediastinal, hilar, or axillary lymph nodes. Thyroid gland, trachea, and esophagus demonstrate no significant findings. Lungs/Pleura: No airspace disease, effusion, or pneumothorax. Central airways are patent. Upper Abdomen: No acute abnormality. Musculoskeletal: No acute or destructive bony lesions. Bilateral shoulder arthroplasties are noted. Reconstructed images demonstrate no additional findings. Review of the MIP images confirms the above findings. IMPRESSION: 1. No evidence of pulmonary embolus. 2. No acute intrathoracic process. 3. Coronary vasculature atherosclerosis, greatest in the LAD distribution. Electronically Signed   By: Sharlet Salina M.D.   On: 07/03/2020 19:38    Procedures Procedures (including critical care time)  Medications Ordered in ED Medications  iohexol (OMNIPAQUE) 350 MG/ML injection 100 mL (100 mLs Intravenous Contrast Given 07/03/20 1921)    ED Course  I have reviewed the triage vital signs and the nursing notes.  Pertinent labs & imaging results that were available during my care of the patient were reviewed by me and considered in my medical decision making (see chart for details).    MDM Rules/Calculators/A&P                          59 yo M with a chief complaint of chest pain.  There is concern for PE at his family doctor's office and so D-dimer was obtained.  It was elevated.  Will obtain a troponin BNP CT angiogram of the chest.  Troponin negative BNP negative.  CT angiogram of the chest is negative for pulmonary embolism.  Most likely this is musculoskeletal based on history and physical.  We will treat the patient as such.  Of note incidentally the patient was found to have likely atherosclerotic disease in the LAD.  I discussed this with the patient and he has been seen by cardiologist and has  had multiple stress test that have all been negative.  He will discuss this information with his family doctor.  8:11 PM:  I have discussed the diagnosis/risks/treatment options with the patient and believe the pt to be eligible for discharge home to follow-up with PCP, cards. We also discussed returning to the ED immediately if new or worsening sx occur. We discussed the sx which are most concerning (e.g., sudden worsening pain, fever, inability to tolerate by mouth) that necessitate immediate return. Medications  administered to the patient during their visit and any new prescriptions provided to the patient are listed below.  Medications given during this visit Medications  iohexol (OMNIPAQUE) 350 MG/ML injection 100 mL (100 mLs Intravenous Contrast Given 07/03/20 1921)     The patient appears reasonably screen and/or stabilized for discharge and I doubt any other medical condition or other [facility] requiring further screening, evaluation, or treatment in the ED at this time prior to discharge.   Final Clinical Impression(s) / ED Diagnoses Final diagnoses:  Atypical chest pain    Rx / DC Orders ED Discharge Orders    None       Melene PlanFloyd, Kean Gautreau, DO 07/03/20 2011

## 2020-07-04 LAB — COMPREHENSIVE METABOLIC PANEL
AG Ratio: 2 (calc) (ref 1.0–2.5)
ALT: 29 U/L (ref 9–46)
AST: 23 U/L (ref 10–35)
Albumin: 4.5 g/dL (ref 3.6–5.1)
Alkaline phosphatase (APISO): 70 U/L (ref 35–144)
BUN: 20 mg/dL (ref 7–25)
CO2: 25 mmol/L (ref 20–32)
Calcium: 8.9 mg/dL (ref 8.6–10.3)
Chloride: 105 mmol/L (ref 98–110)
Creat: 0.96 mg/dL (ref 0.70–1.33)
Globulin: 2.3 g/dL (calc) (ref 1.9–3.7)
Glucose, Bld: 86 mg/dL (ref 65–139)
Potassium: 4 mmol/L (ref 3.5–5.3)
Sodium: 138 mmol/L (ref 135–146)
Total Bilirubin: 0.6 mg/dL (ref 0.2–1.2)
Total Protein: 6.8 g/dL (ref 6.1–8.1)

## 2020-07-04 LAB — CBC
HCT: 41.6 % (ref 38.5–50.0)
Hemoglobin: 14.4 g/dL (ref 13.2–17.1)
MCH: 31.6 pg (ref 27.0–33.0)
MCHC: 34.6 g/dL (ref 32.0–36.0)
MCV: 91.2 fL (ref 80.0–100.0)
MPV: 11.6 fL (ref 7.5–12.5)
Platelets: 149 10*3/uL (ref 140–400)
RBC: 4.56 10*6/uL (ref 4.20–5.80)
RDW: 12.3 % (ref 11.0–15.0)
WBC: 5 10*3/uL (ref 3.8–10.8)

## 2020-07-04 LAB — D-DIMER, QUANTITATIVE: D-Dimer, Quant: 1.08 mcg/mL FEU — ABNORMAL HIGH (ref <0.50)

## 2020-07-15 ENCOUNTER — Encounter: Payer: Self-pay | Admitting: Physician Assistant

## 2020-07-15 ENCOUNTER — Ambulatory Visit (INDEPENDENT_AMBULATORY_CARE_PROVIDER_SITE_OTHER): Payer: BC Managed Care – PPO | Admitting: Physician Assistant

## 2020-07-15 ENCOUNTER — Other Ambulatory Visit: Payer: Self-pay

## 2020-07-15 VITALS — BP 136/88 | HR 62 | Ht 69.0 in | Wt 218.0 lb

## 2020-07-15 DIAGNOSIS — N400 Enlarged prostate without lower urinary tract symptoms: Secondary | ICD-10-CM

## 2020-07-15 DIAGNOSIS — F411 Generalized anxiety disorder: Secondary | ICD-10-CM

## 2020-07-15 DIAGNOSIS — E782 Mixed hyperlipidemia: Secondary | ICD-10-CM | POA: Diagnosis not present

## 2020-07-15 DIAGNOSIS — F3341 Major depressive disorder, recurrent, in partial remission: Secondary | ICD-10-CM | POA: Diagnosis not present

## 2020-07-15 DIAGNOSIS — Z125 Encounter for screening for malignant neoplasm of prostate: Secondary | ICD-10-CM | POA: Diagnosis not present

## 2020-07-15 LAB — CBC
HCT: 43.6 % (ref 38.5–50.0)
Hemoglobin: 15 g/dL (ref 13.2–17.1)
MCH: 31.9 pg (ref 27.0–33.0)
MCHC: 34.4 g/dL (ref 32.0–36.0)
MCV: 92.8 fL (ref 80.0–100.0)
MPV: 11.4 fL (ref 7.5–12.5)
Platelets: 177 10*3/uL (ref 140–400)
RBC: 4.7 10*6/uL (ref 4.20–5.80)
RDW: 12.1 % (ref 11.0–15.0)
WBC: 5.5 10*3/uL (ref 3.8–10.8)

## 2020-07-15 LAB — LIPID PANEL W/REFLEX DIRECT LDL
Cholesterol: 163 mg/dL (ref <200)
HDL: 55 mg/dL (ref >=40)
LDL Cholesterol (Calc): 89 mg/dL (calc)
Non-HDL Cholesterol (Calc): 108 mg/dL (calc) (ref <130)
Total CHOL/HDL Ratio: 3 (calc) (ref <5.0)
Triglycerides: 96 mg/dL (ref <150)

## 2020-07-15 LAB — PSA: PSA: 0.32 ng/mL (ref <=4.0)

## 2020-07-15 MED ORDER — ALPRAZOLAM 0.5 MG PO TABS: 0.5000 mg | ORAL_TABLET | Freq: Every day | ORAL | 2 refills | Status: DC | PRN

## 2020-07-15 MED ORDER — SERTRALINE HCL 100 MG PO TABS: 200.0000 mg | ORAL_TABLET | Freq: Every day | ORAL | 3 refills | Status: DC

## 2020-07-15 NOTE — Progress Notes (Signed)
Subjective:    Patient ID: Shawn Lloyd, male    DOB: 03/06/1961, 59 y.o.   MRN: [id number]  HPI  Pt is a 15 male with MDD, GAD, HLD, insomnia who presents to the clinic for medication refills.   He is doing well today. He is not having any problems with mood. He occasionally uses xanax but does need refill. He does not take daily. No SI/HC. He is compliant with zoloft.   .. Active Ambulatory Problems    Diagnosis Date Noted  . NAUSEA 04/20/2009  . RUQ PAIN 04/20/2009  . GAD (generalized anxiety disorder) 02/03/2012  . MDD (major depressive disorder) 02/03/2012  . Hyperlipidemia 10/09/2014  . Burning pain 10/15/2014  . Numbness in both legs 10/15/2014  . Right leg pain 11/18/2014  . BPH without urinary obstruction 07/19/2016  . Enlarged prostate 07/19/2016  . Suicidal ideation 07/19/2016  . Acute right-sided low back pain with right-sided sciatica 09/30/2016  . SI (sacroiliac) joint inflammation (HCC) 09/30/2016  . Primary osteoarthritis of right shoulder 01/17/2018  . Right hand weakness 06/17/2019  . Numbness and tingling in right hand 06/17/2019  . Abrasion of multiple sites of right hand and finger 03/14/2020  . DDD (degenerative disc disease), cervical 07/03/2020  . Chest tightness 07/03/2020   Resolved Ambulatory Problems    Diagnosis Date Noted  . Diarrhea 04/20/2009  . Cellulitis of right lower extremity 11/18/2014   Past Medical History:  Diagnosis Date  . Anxiety   . Depression   . Hyperlipemia   . Mood disorder (HCC)            Review of Systems  All other systems reviewed and are negative.      Objective:   Physical Exam Vitals reviewed.  Constitutional:      Appearance: Normal appearance.  HENT:     Head: Normocephalic.  Cardiovascular:     Rate and Rhythm: Normal rate and regular rhythm.     Pulses: Normal pulses.     Heart sounds: Normal heart sounds.  Pulmonary:     Effort: Pulmonary effort is normal.     Breath sounds: Normal  breath sounds.  Neurological:     General: No focal deficit present.     Mental Status: He is alert and oriented to person, place, and time.  Psychiatric:        Mood and Affect: Mood normal.       .. Depression screen [facility] 2/9 07/15/2020 06/15/2019 01/17/2018  Decreased Interest 1 0 2  Down, Depressed, Hopeless 0 0 1  PHQ - 2 Score 1 0 3  Altered sleeping 1 1 2   Tired, decreased energy 1 1 2   Change in appetite 1 0 2  Feeling bad or failure about yourself  0 0 0  Trouble concentrating 0 0 1  Moving slowly or fidgety/restless 0 0 1  Suicidal thoughts 0 0 0  PHQ-9 Score 4 2 11   Difficult doing work/chores Not difficult at all Not difficult at all Not difficult at all   . GAD 7 : Generalized Anxiety Score 07/15/2020 06/15/2019 01/17/2018  Nervous, Anxious, on Edge 1 0 1  Control/stop worrying 1 0 1  Worry too much - different things 1 0 2  Trouble relaxing 1 0 1  Restless 0 0 1  Easily annoyed or irritable 1 0 2  Afraid - awful might happen 0 0 0  Total GAD 7 Score 5 0 8  Anxiety Difficulty Not difficult at all Not difficult  at all Not difficult at all         Assessment & Plan:  .Marland KitchenHuan was seen today for depression and anxiety.  Diagnoses and all orders for this visit:  Mixed hyperlipidemia -     Lipid Panel w/reflex Direct LDL  GAD (generalized anxiety disorder) -     sertraline (ZOLOFT) 100 MG tablet; Take 2 tablets (200 mg total) by mouth daily. -     ALPRAZolam (XANAX) 0.5 MG tablet; Take 1 tablet (0.5 mg total) by mouth daily as needed.  Recurrent major depressive disorder, in partial remission (HCC) -     sertraline (ZOLOFT) 100 MG tablet; Take 2 tablets (200 mg total) by mouth daily.  Prostate cancer screening -     PSA -     CBC   PHQ/GAD numbers great.  Needs labs for medication management.  Refills sent.  Follow up in 1 year.  Colonoscopy UTD.  PSA ordered.

## 2020-07-16 ENCOUNTER — Other Ambulatory Visit: Payer: Self-pay | Admitting: Physician Assistant

## 2020-07-16 MED ORDER — ATORVASTATIN CALCIUM 40 MG PO TABS: 40.0000 mg | ORAL_TABLET | Freq: Every day | ORAL | 3 refills | Status: DC

## 2020-07-16 NOTE — Progress Notes (Signed)
Shawn Lloyd,   Wow cholesterol looks so MUCH better.  PSA normal.  CBC great.

## 2020-07-25 IMAGING — DX DG CERVICAL SPINE COMPLETE 4+V
8 series · 8 of 8 positions shown · non-contrast
Comparison: None.

CLINICAL DATA: Decreased right hand grip strength.

EXAM:
CERVICAL SPINE - COMPLETE 4+ VIEW

[c-spine lat]
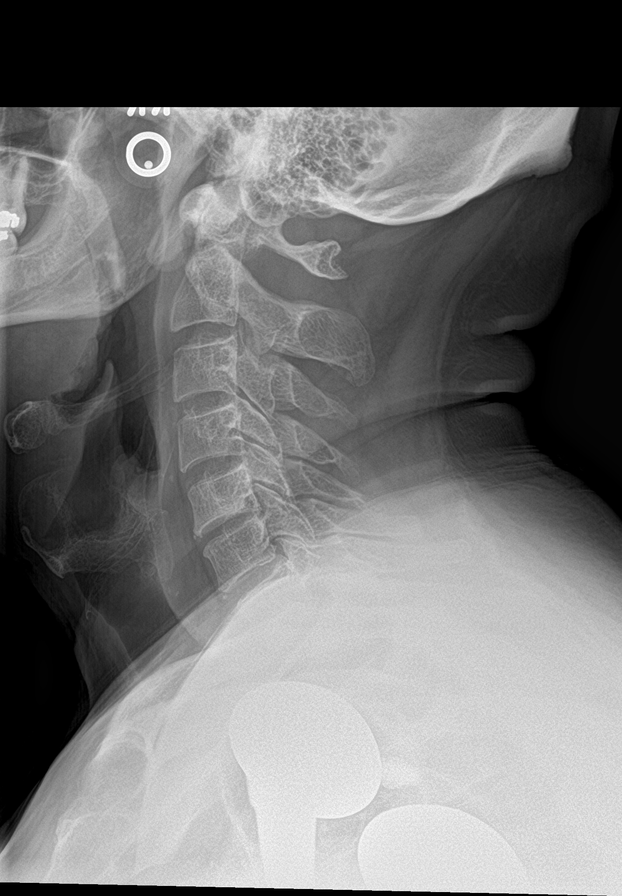

[c-spine obl (1 of 2)]
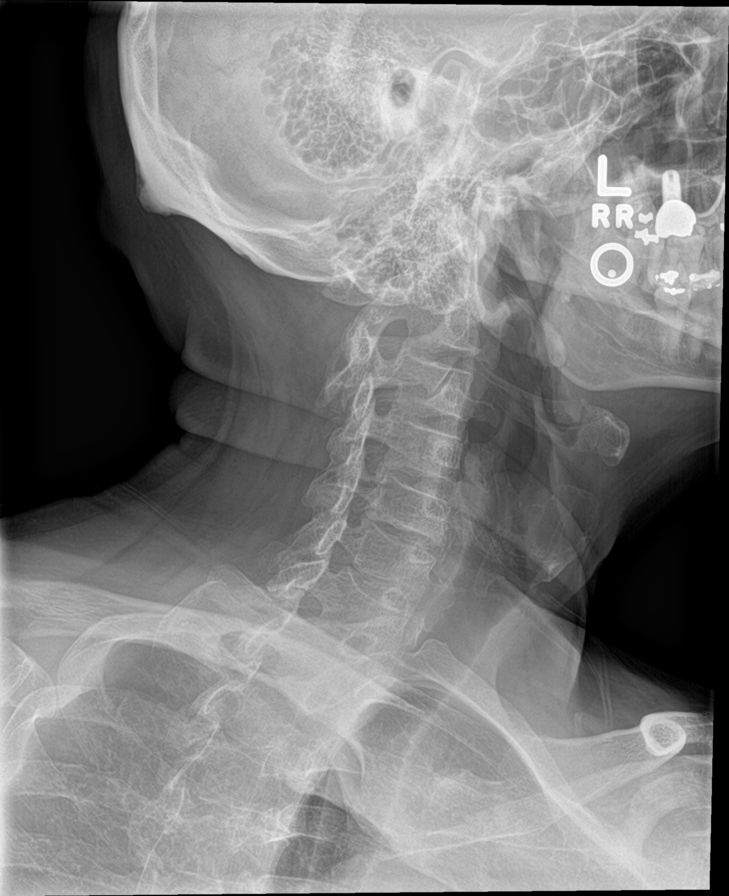

[c-spine obl (2 of 2)]
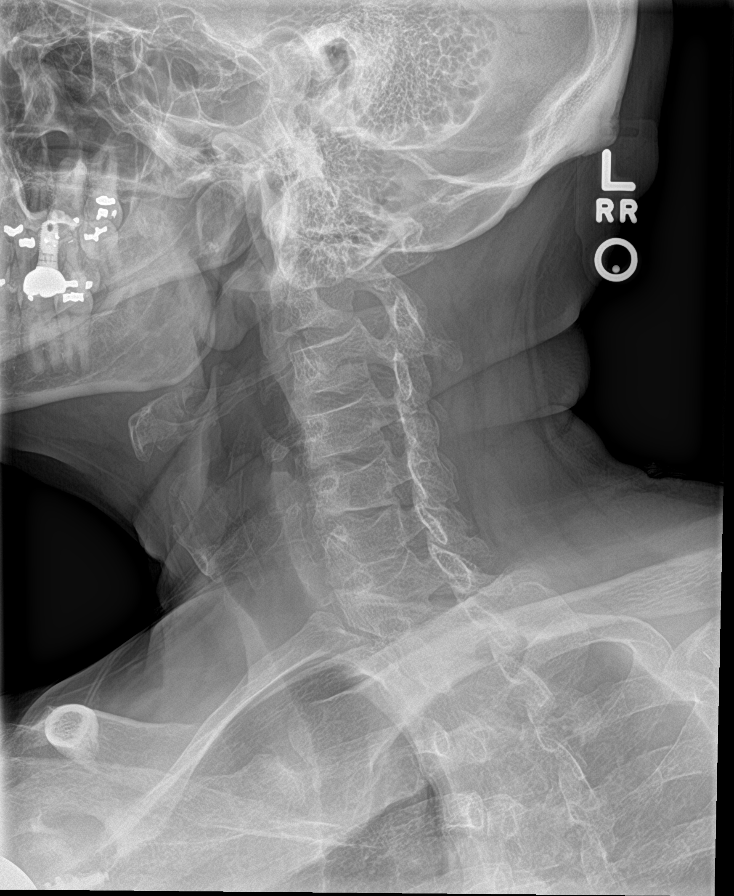

[c-spine ap]
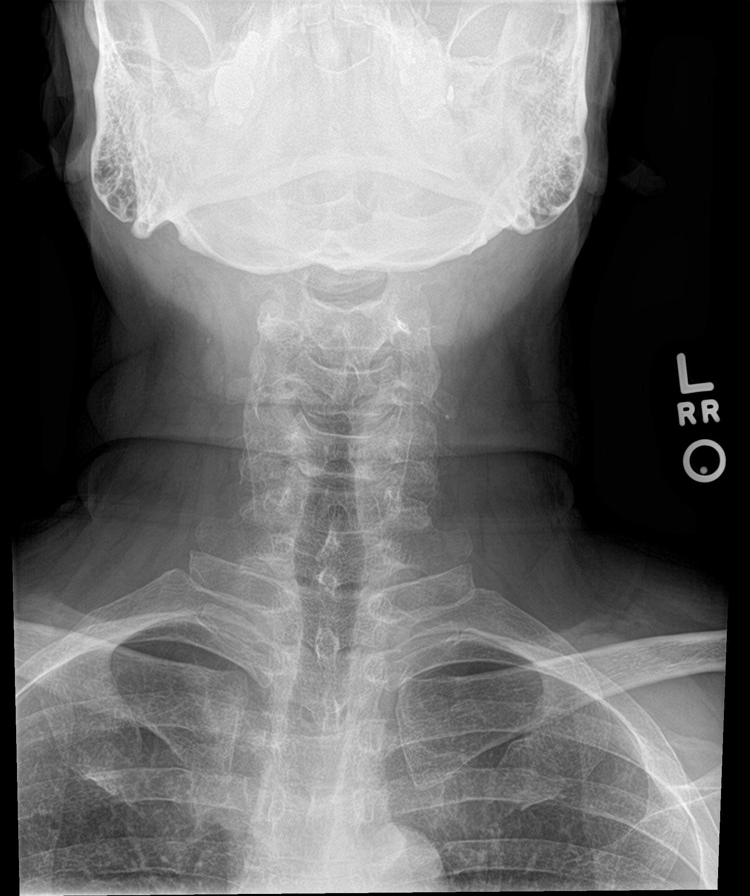

[c-spine open mouth]
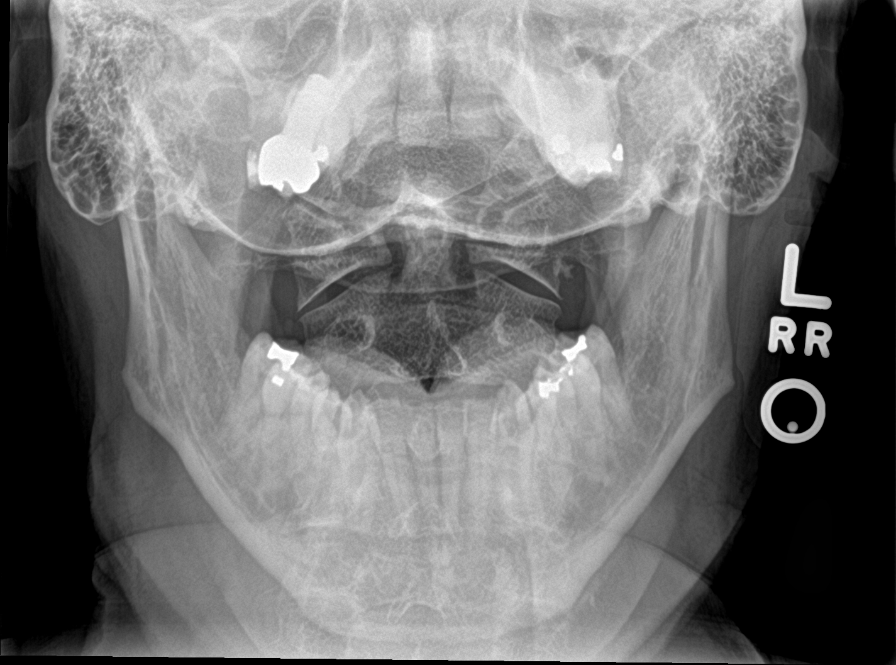

[c-spine swimmers (1 of 2)]
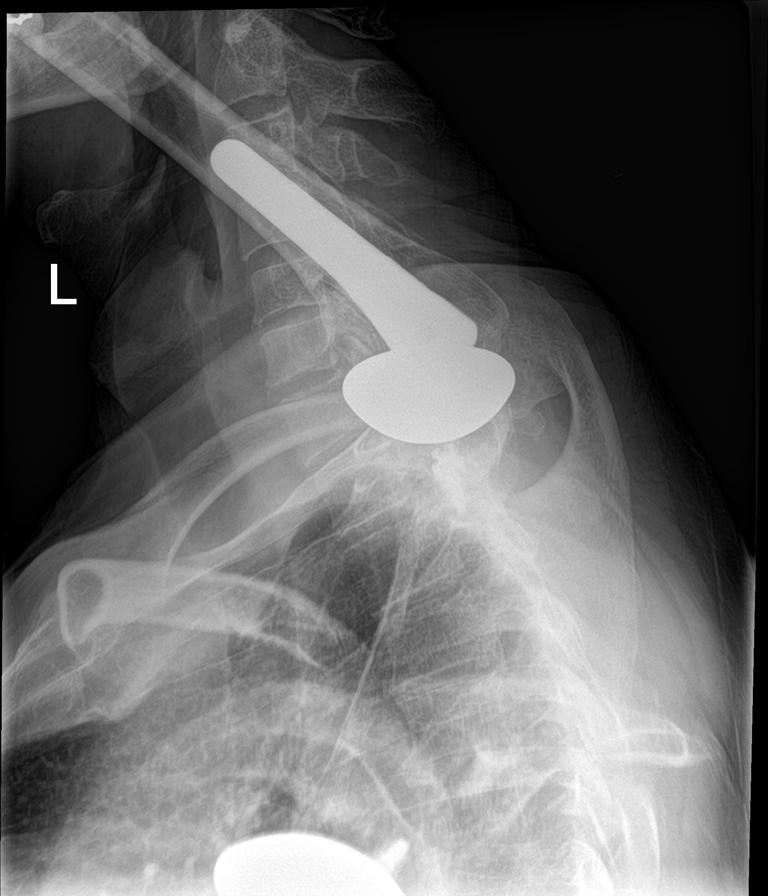

[[person_name]]
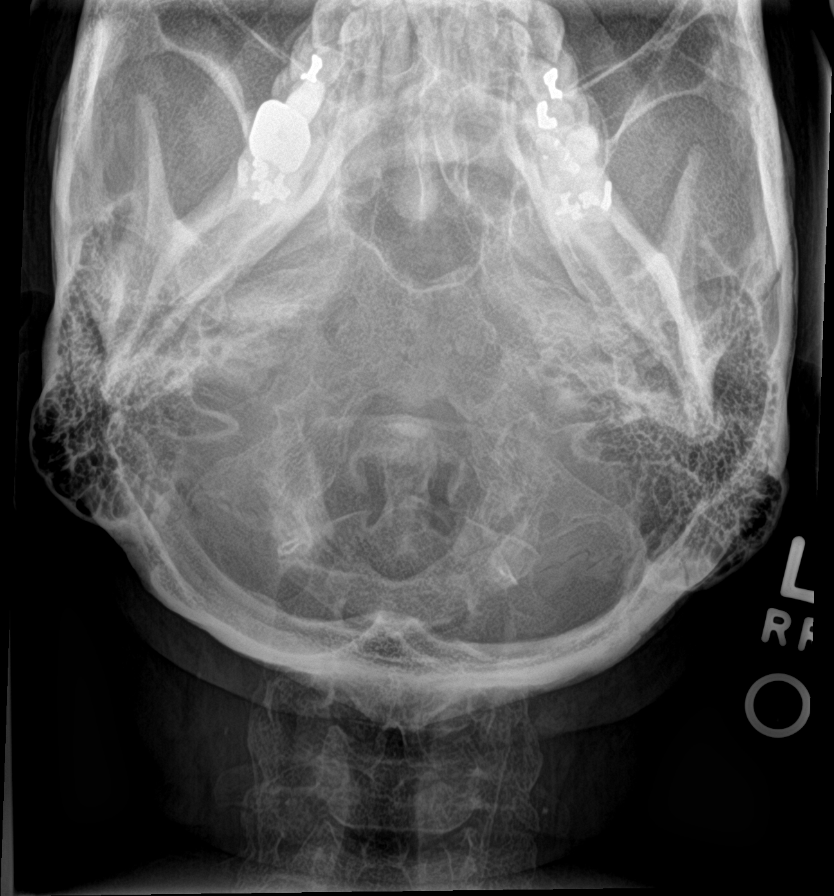

[c-spine swimmers (2 of 2)]
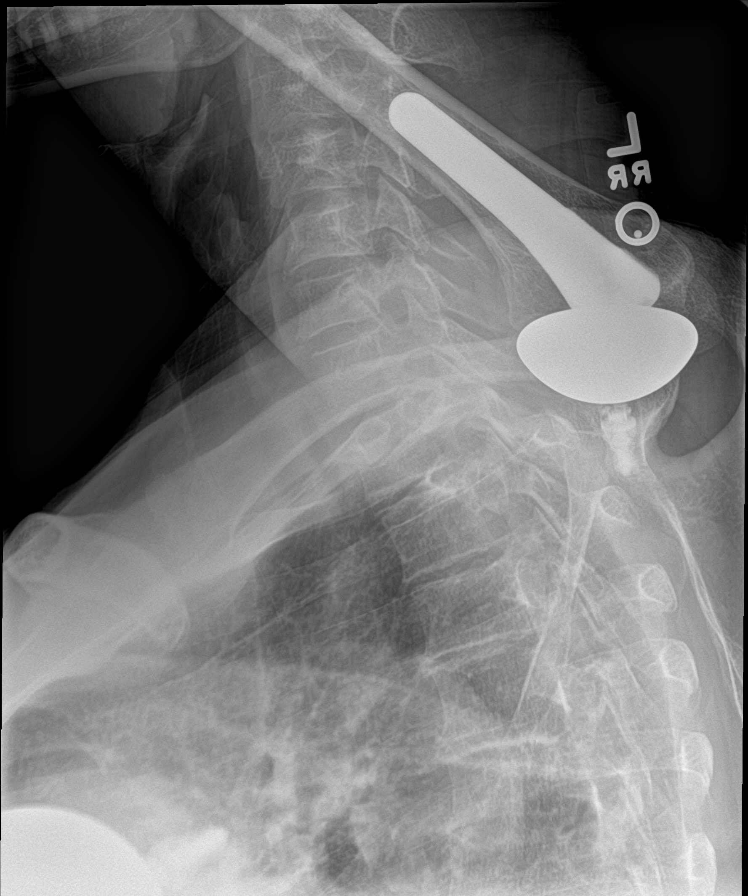

[8 of 8 positions shown; findings below may reference images not displayed]

FINDINGS: Normal alignment. No fracture. Disc spaces maintained. Prevertebral
soft tissues are normal. No significant neural foraminal narrowing.
Mild degenerative facet disease.
IMPRESSION: No acute bony abnormality.  Mild degenerative facet disease.

## 2020-08-14 ENCOUNTER — Ambulatory Visit (INDEPENDENT_AMBULATORY_CARE_PROVIDER_SITE_OTHER): Payer: BC Managed Care – PPO

## 2020-08-14 ENCOUNTER — Ambulatory Visit (INDEPENDENT_AMBULATORY_CARE_PROVIDER_SITE_OTHER): Payer: BC Managed Care – PPO | Admitting: Sports Medicine

## 2020-08-14 ENCOUNTER — Other Ambulatory Visit: Payer: Self-pay

## 2020-08-14 DIAGNOSIS — M545 Low back pain, unspecified: Secondary | ICD-10-CM | POA: Diagnosis not present

## 2020-08-14 DIAGNOSIS — M5441 Lumbago with sciatica, right side: Secondary | ICD-10-CM | POA: Diagnosis not present

## 2020-08-14 DIAGNOSIS — M5030 Other cervical disc degeneration, unspecified cervical region: Secondary | ICD-10-CM

## 2020-08-14 MED ORDER — PREDNISONE 50 MG PO TABS: ORAL_TABLET | ORAL | 0 refills | Status: DC

## 2020-08-14 NOTE — Progress Notes (Signed)
    Procedures performed today:    None.  Independent interpretation of notes and tests performed by another provider:   None.  Brief History, Exam, Impression, and Recommendations:    Acute right-sided low back pain with right-sided sciatica Shawn Lloyd returns, he is a very pleasant 59 year old male, known historical lumbar DDD, L5-S1. He did well with physical therapy in the past, we are going to restart PT with Georgie Chard, add 5 days of prednisone, updated x-rays. Return to see me in 6 weeks, MRI for interventional planning if no better.  DDD (degenerative disc disease), cervical Resolved with conservative treatment.    ___________________________________________ Shawn Lloyd. Shawn Lloyd, M.D., ABFM., CAQSM. Primary Care and Sports Medicine [facility]  Adjunct Instructor of Family Medicine  [employer]

## 2020-08-14 NOTE — Assessment & Plan Note (Signed)
Resolved with conservative treatment. 

## 2020-08-14 NOTE — Assessment & Plan Note (Signed)
Shawn Lloyd returns, he is a very pleasant 59 year old male, known historical lumbar DDD, L5-S1. He did well with physical therapy in the past, we are going to restart PT with Georgie Chard, add 5 days of prednisone, updated x-rays. Return to see me in 6 weeks, MRI for interventional planning if no better.

## 2020-08-26 DIAGNOSIS — R531 Weakness: Secondary | ICD-10-CM | POA: Diagnosis not present

## 2020-08-26 DIAGNOSIS — M5451 Vertebrogenic low back pain: Secondary | ICD-10-CM | POA: Diagnosis not present

## 2020-08-26 DIAGNOSIS — M546 Pain in thoracic spine: Secondary | ICD-10-CM | POA: Diagnosis not present

## 2020-08-28 DIAGNOSIS — M546 Pain in thoracic spine: Secondary | ICD-10-CM | POA: Diagnosis not present

## 2020-08-28 DIAGNOSIS — R531 Weakness: Secondary | ICD-10-CM | POA: Diagnosis not present

## 2020-08-28 DIAGNOSIS — M5451 Vertebrogenic low back pain: Secondary | ICD-10-CM | POA: Diagnosis not present

## 2020-09-01 DIAGNOSIS — R531 Weakness: Secondary | ICD-10-CM | POA: Diagnosis not present

## 2020-09-01 DIAGNOSIS — M5451 Vertebrogenic low back pain: Secondary | ICD-10-CM | POA: Diagnosis not present

## 2020-09-01 DIAGNOSIS — M546 Pain in thoracic spine: Secondary | ICD-10-CM | POA: Diagnosis not present

## 2020-09-04 DIAGNOSIS — M546 Pain in thoracic spine: Secondary | ICD-10-CM | POA: Diagnosis not present

## 2020-09-04 DIAGNOSIS — M5451 Vertebrogenic low back pain: Secondary | ICD-10-CM | POA: Diagnosis not present

## 2020-09-04 DIAGNOSIS — R531 Weakness: Secondary | ICD-10-CM | POA: Diagnosis not present

## 2020-09-08 DIAGNOSIS — M546 Pain in thoracic spine: Secondary | ICD-10-CM | POA: Diagnosis not present

## 2020-09-08 DIAGNOSIS — R531 Weakness: Secondary | ICD-10-CM | POA: Diagnosis not present

## 2020-09-08 DIAGNOSIS — M5451 Vertebrogenic low back pain: Secondary | ICD-10-CM | POA: Diagnosis not present

## 2020-09-11 DIAGNOSIS — R531 Weakness: Secondary | ICD-10-CM | POA: Diagnosis not present

## 2020-09-11 DIAGNOSIS — M546 Pain in thoracic spine: Secondary | ICD-10-CM | POA: Diagnosis not present

## 2020-09-11 DIAGNOSIS — M5451 Vertebrogenic low back pain: Secondary | ICD-10-CM | POA: Diagnosis not present

## 2020-09-15 DIAGNOSIS — M5451 Vertebrogenic low back pain: Secondary | ICD-10-CM | POA: Diagnosis not present

## 2020-09-15 DIAGNOSIS — R531 Weakness: Secondary | ICD-10-CM | POA: Diagnosis not present

## 2020-09-15 DIAGNOSIS — M546 Pain in thoracic spine: Secondary | ICD-10-CM | POA: Diagnosis not present

## 2020-09-17 DIAGNOSIS — R531 Weakness: Secondary | ICD-10-CM | POA: Diagnosis not present

## 2020-09-17 DIAGNOSIS — M546 Pain in thoracic spine: Secondary | ICD-10-CM | POA: Diagnosis not present

## 2020-09-17 DIAGNOSIS — M5451 Vertebrogenic low back pain: Secondary | ICD-10-CM | POA: Diagnosis not present

## 2020-09-23 DIAGNOSIS — M5451 Vertebrogenic low back pain: Secondary | ICD-10-CM | POA: Diagnosis not present

## 2020-09-23 DIAGNOSIS — R531 Weakness: Secondary | ICD-10-CM | POA: Diagnosis not present

## 2020-09-23 DIAGNOSIS — M546 Pain in thoracic spine: Secondary | ICD-10-CM | POA: Diagnosis not present

## 2020-09-25 DIAGNOSIS — R531 Weakness: Secondary | ICD-10-CM | POA: Diagnosis not present

## 2020-09-25 DIAGNOSIS — M5451 Vertebrogenic low back pain: Secondary | ICD-10-CM | POA: Diagnosis not present

## 2020-09-25 DIAGNOSIS — M546 Pain in thoracic spine: Secondary | ICD-10-CM | POA: Diagnosis not present

## 2020-11-17 ENCOUNTER — Encounter: Payer: Self-pay | Admitting: Physician Assistant

## 2020-11-17 ENCOUNTER — Telehealth (INDEPENDENT_AMBULATORY_CARE_PROVIDER_SITE_OTHER): Payer: BC Managed Care – PPO | Admitting: Physician Assistant

## 2020-11-17 VITALS — Temp 97.9°F | Ht 69.0 in | Wt 218.0 lb

## 2020-11-17 DIAGNOSIS — H6983 Other specified disorders of Eustachian tube, bilateral: Secondary | ICD-10-CM

## 2020-11-17 DIAGNOSIS — J0100 Acute maxillary sinusitis, unspecified: Secondary | ICD-10-CM

## 2020-11-17 MED ORDER — METHYLPREDNISOLONE 4 MG PO TBPK: ORAL_TABLET | ORAL | 0 refills | Status: DC

## 2020-11-17 MED ORDER — AMOXICILLIN-POT CLAVULANATE 875-125 MG PO TABS: 1.0000 | ORAL_TABLET | Freq: Two times a day (BID) | ORAL | 0 refills | Status: AC

## 2020-11-17 NOTE — Progress Notes (Signed)
11/08/2020 - flew to   Had headache for 2 days Sinus trouble Flew back Friday  Nasal drainage with blood  He had same issue in 1998 after a flight (has flown since then, but feels the same)

## 2020-11-17 NOTE — Progress Notes (Signed)
Patient ID: Shawn Lloyd, male   DOB: [birth year], 60 y.o.   MRN: [id number] .Marland KitchenVirtual Visit via Telephone Note  I connected with Shawn Lloyd on 11/17/20 at  1:00 PM EDT by telephone and verified that I am speaking with the correct person using two identifiers.  Location: Patient: home  Provider: clinic  .Marland KitchenParticipating in visit:  Patient: Shawn Lloyd Provider: Tandy Gaw PA-C   I discussed the limitations, risks, security and privacy concerns of performing an evaluation and management service by telephone and the availability of in person appointments. I also discussed with the patient that there may be a patient responsible charge related to this service. The patient expressed understanding and agreed to proceed.   History of Present Illness: Pt is a 60 yo male who presents to the clinic for headache, sinus pressure, congestion, cough, sinus drainage for the last week. Seems to be worsening for the last 2 days. Hx of sinus infections after airplane travel. He just got back from [place]. He is having lots of ear popping. More pressure behind sinuses and eyes. Taking sudafed and delsym and afrin with little benefit. No fever, chills, body aches, wheezing, GI symptoms. No sick contacts. He has had 2 vaccines for covid.    .. Active Ambulatory Problems    Diagnosis Date Noted  . NAUSEA 04/20/2009  . RUQ PAIN 04/20/2009  . GAD (generalized anxiety disorder) 02/03/2012  . MDD (major depressive disorder) 02/03/2012  . Hyperlipidemia 10/09/2014  . Burning pain 10/15/2014  . Numbness in both legs 10/15/2014  . Right leg pain 11/18/2014  . BPH without urinary obstruction 07/19/2016  . Enlarged prostate 07/19/2016  . Suicidal ideation 07/19/2016  . Acute right-sided low back pain with right-sided sciatica 09/30/2016  . SI (sacroiliac) joint inflammation (HCC) 09/30/2016  . Primary osteoarthritis of right shoulder 01/17/2018  . Right hand weakness 06/17/2019  . Numbness and tingling in right  hand 06/17/2019  . Abrasion of multiple sites of right hand and finger 03/14/2020  . DDD (degenerative disc disease), cervical 07/03/2020  . Chest tightness 07/03/2020   Resolved Ambulatory Problems    Diagnosis Date Noted  . Diarrhea 04/20/2009  . Cellulitis of right lower extremity 11/18/2014   Past Medical History:  Diagnosis Date  . Anxiety   . Depression   . Hyperlipemia   . Mood disorder (HCC)    Reviewed med, allergy, problem list.    Observations/Objective: No acute distress No labored breathing Dry cough.   .. Today's Vitals   11/17/20 1304  Temp: 97.9 F (36.6 C)  TempSrc: Oral  Weight: 218 lb (98.9 kg)  Height: 5\' 9"  (1.753 m)   Body mass index is 32.19 kg/m.    Assessment and Plan: . Dewon was seen today for sinus problem.  Diagnoses and all orders for this visit:  Acute non-recurrent maxillary sinusitis -     amoxicillin-clavulanate (AUGMENTIN) 875-125 MG tablet; Take 1 tablet by mouth 2 (two) times daily for 10 days. -     methylPREDNISolone (MEDROL DOSEPAK) 4 MG TBPK tablet; Take as directed by package insert.  Dysfunction of both eustachian tubes -     methylPREDNISolone (MEDROL DOSEPAK) 4 MG TBPK tablet; Take as directed by package insert.   Treated for sinus infection and ETD. Continue flonase. augmentin and medrol dose pack for current infection. Follow up as needed or if symptoms persist. Out of window for covid testing.   Follow Up Instructions:    I discussed the assessment and treatment plan with the  patient. The patient was provided an opportunity to ask questions and all were answered. The patient agreed with the plan and demonstrated an understanding of the instructions.   The patient was advised to call back or seek an in-person evaluation if the symptoms worsen or if the condition fails to improve as anticipated.  I provided 10 minutes of non-face-to-face time during this encounter.   Tandy Gaw, PA-C

## 2020-12-08 ENCOUNTER — Encounter: Payer: Self-pay | Admitting: Physician Assistant

## 2021-01-07 DIAGNOSIS — M4728 Other spondylosis with radiculopathy, sacral and sacrococcygeal region: Secondary | ICD-10-CM | POA: Diagnosis not present

## 2021-01-07 DIAGNOSIS — M9904 Segmental and somatic dysfunction of sacral region: Secondary | ICD-10-CM | POA: Diagnosis not present

## 2021-01-07 DIAGNOSIS — M5441 Lumbago with sciatica, right side: Secondary | ICD-10-CM | POA: Diagnosis not present

## 2021-01-07 DIAGNOSIS — M9903 Segmental and somatic dysfunction of lumbar region: Secondary | ICD-10-CM | POA: Diagnosis not present

## 2021-01-12 DIAGNOSIS — M9904 Segmental and somatic dysfunction of sacral region: Secondary | ICD-10-CM | POA: Diagnosis not present

## 2021-01-12 DIAGNOSIS — M5441 Lumbago with sciatica, right side: Secondary | ICD-10-CM | POA: Diagnosis not present

## 2021-01-12 DIAGNOSIS — M4728 Other spondylosis with radiculopathy, sacral and sacrococcygeal region: Secondary | ICD-10-CM | POA: Diagnosis not present

## 2021-01-12 DIAGNOSIS — M9903 Segmental and somatic dysfunction of lumbar region: Secondary | ICD-10-CM | POA: Diagnosis not present

## 2021-01-13 DIAGNOSIS — M9903 Segmental and somatic dysfunction of lumbar region: Secondary | ICD-10-CM | POA: Diagnosis not present

## 2021-01-13 DIAGNOSIS — M5441 Lumbago with sciatica, right side: Secondary | ICD-10-CM | POA: Diagnosis not present

## 2021-01-13 DIAGNOSIS — M4728 Other spondylosis with radiculopathy, sacral and sacrococcygeal region: Secondary | ICD-10-CM | POA: Diagnosis not present

## 2021-01-13 DIAGNOSIS — M9904 Segmental and somatic dysfunction of sacral region: Secondary | ICD-10-CM | POA: Diagnosis not present

## 2021-01-14 DIAGNOSIS — M9903 Segmental and somatic dysfunction of lumbar region: Secondary | ICD-10-CM | POA: Diagnosis not present

## 2021-01-14 DIAGNOSIS — M4728 Other spondylosis with radiculopathy, sacral and sacrococcygeal region: Secondary | ICD-10-CM | POA: Diagnosis not present

## 2021-01-14 DIAGNOSIS — M5441 Lumbago with sciatica, right side: Secondary | ICD-10-CM | POA: Diagnosis not present

## 2021-01-14 DIAGNOSIS — M9904 Segmental and somatic dysfunction of sacral region: Secondary | ICD-10-CM | POA: Diagnosis not present

## 2021-01-15 DIAGNOSIS — M4728 Other spondylosis with radiculopathy, sacral and sacrococcygeal region: Secondary | ICD-10-CM | POA: Diagnosis not present

## 2021-01-15 DIAGNOSIS — M5441 Lumbago with sciatica, right side: Secondary | ICD-10-CM | POA: Diagnosis not present

## 2021-01-15 DIAGNOSIS — M9904 Segmental and somatic dysfunction of sacral region: Secondary | ICD-10-CM | POA: Diagnosis not present

## 2021-01-15 DIAGNOSIS — M9903 Segmental and somatic dysfunction of lumbar region: Secondary | ICD-10-CM | POA: Diagnosis not present

## 2021-01-15 DIAGNOSIS — T701XXA Sinus barotrauma, initial encounter: Secondary | ICD-10-CM | POA: Diagnosis not present

## 2021-01-15 DIAGNOSIS — H903 Sensorineural hearing loss, bilateral: Secondary | ICD-10-CM | POA: Diagnosis not present

## 2021-01-15 DIAGNOSIS — H9319 Tinnitus, unspecified ear: Secondary | ICD-10-CM | POA: Diagnosis not present

## 2021-01-20 DIAGNOSIS — M4728 Other spondylosis with radiculopathy, sacral and sacrococcygeal region: Secondary | ICD-10-CM | POA: Diagnosis not present

## 2021-01-20 DIAGNOSIS — M5441 Lumbago with sciatica, right side: Secondary | ICD-10-CM | POA: Diagnosis not present

## 2021-01-20 DIAGNOSIS — M9904 Segmental and somatic dysfunction of sacral region: Secondary | ICD-10-CM | POA: Diagnosis not present

## 2021-01-20 DIAGNOSIS — M9903 Segmental and somatic dysfunction of lumbar region: Secondary | ICD-10-CM | POA: Diagnosis not present

## 2021-01-21 DIAGNOSIS — M9904 Segmental and somatic dysfunction of sacral region: Secondary | ICD-10-CM | POA: Diagnosis not present

## 2021-01-21 DIAGNOSIS — M5441 Lumbago with sciatica, right side: Secondary | ICD-10-CM | POA: Diagnosis not present

## 2021-01-21 DIAGNOSIS — M9903 Segmental and somatic dysfunction of lumbar region: Secondary | ICD-10-CM | POA: Diagnosis not present

## 2021-01-21 DIAGNOSIS — M4728 Other spondylosis with radiculopathy, sacral and sacrococcygeal region: Secondary | ICD-10-CM | POA: Diagnosis not present

## 2021-01-22 DIAGNOSIS — M4728 Other spondylosis with radiculopathy, sacral and sacrococcygeal region: Secondary | ICD-10-CM | POA: Diagnosis not present

## 2021-01-22 DIAGNOSIS — M5441 Lumbago with sciatica, right side: Secondary | ICD-10-CM | POA: Diagnosis not present

## 2021-01-22 DIAGNOSIS — M9904 Segmental and somatic dysfunction of sacral region: Secondary | ICD-10-CM | POA: Diagnosis not present

## 2021-01-22 DIAGNOSIS — M9903 Segmental and somatic dysfunction of lumbar region: Secondary | ICD-10-CM | POA: Diagnosis not present

## 2021-01-26 DIAGNOSIS — M5441 Lumbago with sciatica, right side: Secondary | ICD-10-CM | POA: Diagnosis not present

## 2021-01-26 DIAGNOSIS — M9903 Segmental and somatic dysfunction of lumbar region: Secondary | ICD-10-CM | POA: Diagnosis not present

## 2021-01-26 DIAGNOSIS — M9904 Segmental and somatic dysfunction of sacral region: Secondary | ICD-10-CM | POA: Diagnosis not present

## 2021-01-26 DIAGNOSIS — M4728 Other spondylosis with radiculopathy, sacral and sacrococcygeal region: Secondary | ICD-10-CM | POA: Diagnosis not present

## 2021-02-03 DIAGNOSIS — M9903 Segmental and somatic dysfunction of lumbar region: Secondary | ICD-10-CM | POA: Diagnosis not present

## 2021-02-03 DIAGNOSIS — M9904 Segmental and somatic dysfunction of sacral region: Secondary | ICD-10-CM | POA: Diagnosis not present

## 2021-02-03 DIAGNOSIS — M4728 Other spondylosis with radiculopathy, sacral and sacrococcygeal region: Secondary | ICD-10-CM | POA: Diagnosis not present

## 2021-02-03 DIAGNOSIS — M5441 Lumbago with sciatica, right side: Secondary | ICD-10-CM | POA: Diagnosis not present

## 2021-02-04 DIAGNOSIS — M4728 Other spondylosis with radiculopathy, sacral and sacrococcygeal region: Secondary | ICD-10-CM | POA: Diagnosis not present

## 2021-02-04 DIAGNOSIS — M9904 Segmental and somatic dysfunction of sacral region: Secondary | ICD-10-CM | POA: Diagnosis not present

## 2021-02-04 DIAGNOSIS — M5441 Lumbago with sciatica, right side: Secondary | ICD-10-CM | POA: Diagnosis not present

## 2021-02-04 DIAGNOSIS — M9903 Segmental and somatic dysfunction of lumbar region: Secondary | ICD-10-CM | POA: Diagnosis not present

## 2021-02-05 DIAGNOSIS — M9903 Segmental and somatic dysfunction of lumbar region: Secondary | ICD-10-CM | POA: Diagnosis not present

## 2021-02-05 DIAGNOSIS — M5441 Lumbago with sciatica, right side: Secondary | ICD-10-CM | POA: Diagnosis not present

## 2021-02-05 DIAGNOSIS — M9904 Segmental and somatic dysfunction of sacral region: Secondary | ICD-10-CM | POA: Diagnosis not present

## 2021-02-05 DIAGNOSIS — M4728 Other spondylosis with radiculopathy, sacral and sacrococcygeal region: Secondary | ICD-10-CM | POA: Diagnosis not present

## 2021-02-06 ENCOUNTER — Encounter: Payer: Self-pay | Admitting: Physician Assistant

## 2021-02-09 DIAGNOSIS — M9903 Segmental and somatic dysfunction of lumbar region: Secondary | ICD-10-CM | POA: Diagnosis not present

## 2021-02-09 DIAGNOSIS — M9904 Segmental and somatic dysfunction of sacral region: Secondary | ICD-10-CM | POA: Diagnosis not present

## 2021-02-09 DIAGNOSIS — M4728 Other spondylosis with radiculopathy, sacral and sacrococcygeal region: Secondary | ICD-10-CM | POA: Diagnosis not present

## 2021-02-09 DIAGNOSIS — M5441 Lumbago with sciatica, right side: Secondary | ICD-10-CM | POA: Diagnosis not present

## 2021-02-11 DIAGNOSIS — M9903 Segmental and somatic dysfunction of lumbar region: Secondary | ICD-10-CM | POA: Diagnosis not present

## 2021-02-11 DIAGNOSIS — M4728 Other spondylosis with radiculopathy, sacral and sacrococcygeal region: Secondary | ICD-10-CM | POA: Diagnosis not present

## 2021-02-11 DIAGNOSIS — M5441 Lumbago with sciatica, right side: Secondary | ICD-10-CM | POA: Diagnosis not present

## 2021-02-11 DIAGNOSIS — M9904 Segmental and somatic dysfunction of sacral region: Secondary | ICD-10-CM | POA: Diagnosis not present

## 2021-02-12 DIAGNOSIS — M9903 Segmental and somatic dysfunction of lumbar region: Secondary | ICD-10-CM | POA: Diagnosis not present

## 2021-02-12 DIAGNOSIS — M4728 Other spondylosis with radiculopathy, sacral and sacrococcygeal region: Secondary | ICD-10-CM | POA: Diagnosis not present

## 2021-02-12 DIAGNOSIS — M5441 Lumbago with sciatica, right side: Secondary | ICD-10-CM | POA: Diagnosis not present

## 2021-02-12 DIAGNOSIS — M9904 Segmental and somatic dysfunction of sacral region: Secondary | ICD-10-CM | POA: Diagnosis not present

## 2021-02-16 DIAGNOSIS — M9904 Segmental and somatic dysfunction of sacral region: Secondary | ICD-10-CM | POA: Diagnosis not present

## 2021-02-16 DIAGNOSIS — M5441 Lumbago with sciatica, right side: Secondary | ICD-10-CM | POA: Diagnosis not present

## 2021-02-16 DIAGNOSIS — M9903 Segmental and somatic dysfunction of lumbar region: Secondary | ICD-10-CM | POA: Diagnosis not present

## 2021-02-16 DIAGNOSIS — M4728 Other spondylosis with radiculopathy, sacral and sacrococcygeal region: Secondary | ICD-10-CM | POA: Diagnosis not present

## 2021-02-19 DIAGNOSIS — M9904 Segmental and somatic dysfunction of sacral region: Secondary | ICD-10-CM | POA: Diagnosis not present

## 2021-02-19 DIAGNOSIS — M9903 Segmental and somatic dysfunction of lumbar region: Secondary | ICD-10-CM | POA: Diagnosis not present

## 2021-02-19 DIAGNOSIS — M4728 Other spondylosis with radiculopathy, sacral and sacrococcygeal region: Secondary | ICD-10-CM | POA: Diagnosis not present

## 2021-02-19 DIAGNOSIS — M5441 Lumbago with sciatica, right side: Secondary | ICD-10-CM | POA: Diagnosis not present

## 2021-03-02 DIAGNOSIS — M4728 Other spondylosis with radiculopathy, sacral and sacrococcygeal region: Secondary | ICD-10-CM | POA: Diagnosis not present

## 2021-03-02 DIAGNOSIS — M9904 Segmental and somatic dysfunction of sacral region: Secondary | ICD-10-CM | POA: Diagnosis not present

## 2021-03-02 DIAGNOSIS — M9903 Segmental and somatic dysfunction of lumbar region: Secondary | ICD-10-CM | POA: Diagnosis not present

## 2021-03-02 DIAGNOSIS — M5441 Lumbago with sciatica, right side: Secondary | ICD-10-CM | POA: Diagnosis not present

## 2021-03-05 DIAGNOSIS — M9904 Segmental and somatic dysfunction of sacral region: Secondary | ICD-10-CM | POA: Diagnosis not present

## 2021-03-05 DIAGNOSIS — M4728 Other spondylosis with radiculopathy, sacral and sacrococcygeal region: Secondary | ICD-10-CM | POA: Diagnosis not present

## 2021-03-05 DIAGNOSIS — M9903 Segmental and somatic dysfunction of lumbar region: Secondary | ICD-10-CM | POA: Diagnosis not present

## 2021-03-05 DIAGNOSIS — M5441 Lumbago with sciatica, right side: Secondary | ICD-10-CM | POA: Diagnosis not present

## 2021-03-09 DIAGNOSIS — M5441 Lumbago with sciatica, right side: Secondary | ICD-10-CM | POA: Diagnosis not present

## 2021-03-09 DIAGNOSIS — M4728 Other spondylosis with radiculopathy, sacral and sacrococcygeal region: Secondary | ICD-10-CM | POA: Diagnosis not present

## 2021-03-09 DIAGNOSIS — M9903 Segmental and somatic dysfunction of lumbar region: Secondary | ICD-10-CM | POA: Diagnosis not present

## 2021-03-09 DIAGNOSIS — M9904 Segmental and somatic dysfunction of sacral region: Secondary | ICD-10-CM | POA: Diagnosis not present

## 2021-03-12 DIAGNOSIS — M9903 Segmental and somatic dysfunction of lumbar region: Secondary | ICD-10-CM | POA: Diagnosis not present

## 2021-03-12 DIAGNOSIS — M5441 Lumbago with sciatica, right side: Secondary | ICD-10-CM | POA: Diagnosis not present

## 2021-03-12 DIAGNOSIS — M4728 Other spondylosis with radiculopathy, sacral and sacrococcygeal region: Secondary | ICD-10-CM | POA: Diagnosis not present

## 2021-03-12 DIAGNOSIS — M9904 Segmental and somatic dysfunction of sacral region: Secondary | ICD-10-CM | POA: Diagnosis not present

## 2021-03-17 DIAGNOSIS — M5441 Lumbago with sciatica, right side: Secondary | ICD-10-CM | POA: Diagnosis not present

## 2021-03-17 DIAGNOSIS — M9904 Segmental and somatic dysfunction of sacral region: Secondary | ICD-10-CM | POA: Diagnosis not present

## 2021-03-17 DIAGNOSIS — M4728 Other spondylosis with radiculopathy, sacral and sacrococcygeal region: Secondary | ICD-10-CM | POA: Diagnosis not present

## 2021-03-17 DIAGNOSIS — M9903 Segmental and somatic dysfunction of lumbar region: Secondary | ICD-10-CM | POA: Diagnosis not present

## 2021-03-19 DIAGNOSIS — M9904 Segmental and somatic dysfunction of sacral region: Secondary | ICD-10-CM | POA: Diagnosis not present

## 2021-03-19 DIAGNOSIS — M5441 Lumbago with sciatica, right side: Secondary | ICD-10-CM | POA: Diagnosis not present

## 2021-03-19 DIAGNOSIS — M4728 Other spondylosis with radiculopathy, sacral and sacrococcygeal region: Secondary | ICD-10-CM | POA: Diagnosis not present

## 2021-03-19 DIAGNOSIS — M9903 Segmental and somatic dysfunction of lumbar region: Secondary | ICD-10-CM | POA: Diagnosis not present

## 2021-03-24 DIAGNOSIS — M5441 Lumbago with sciatica, right side: Secondary | ICD-10-CM | POA: Diagnosis not present

## 2021-03-24 DIAGNOSIS — M9904 Segmental and somatic dysfunction of sacral region: Secondary | ICD-10-CM | POA: Diagnosis not present

## 2021-03-24 DIAGNOSIS — M4728 Other spondylosis with radiculopathy, sacral and sacrococcygeal region: Secondary | ICD-10-CM | POA: Diagnosis not present

## 2021-03-24 DIAGNOSIS — M9903 Segmental and somatic dysfunction of lumbar region: Secondary | ICD-10-CM | POA: Diagnosis not present

## 2021-03-26 DIAGNOSIS — M4728 Other spondylosis with radiculopathy, sacral and sacrococcygeal region: Secondary | ICD-10-CM | POA: Diagnosis not present

## 2021-03-26 DIAGNOSIS — M5441 Lumbago with sciatica, right side: Secondary | ICD-10-CM | POA: Diagnosis not present

## 2021-03-26 DIAGNOSIS — M9904 Segmental and somatic dysfunction of sacral region: Secondary | ICD-10-CM | POA: Diagnosis not present

## 2021-03-26 DIAGNOSIS — M9903 Segmental and somatic dysfunction of lumbar region: Secondary | ICD-10-CM | POA: Diagnosis not present

## 2021-03-30 DIAGNOSIS — M5441 Lumbago with sciatica, right side: Secondary | ICD-10-CM | POA: Diagnosis not present

## 2021-03-30 DIAGNOSIS — M9904 Segmental and somatic dysfunction of sacral region: Secondary | ICD-10-CM | POA: Diagnosis not present

## 2021-03-30 DIAGNOSIS — M4728 Other spondylosis with radiculopathy, sacral and sacrococcygeal region: Secondary | ICD-10-CM | POA: Diagnosis not present

## 2021-03-30 DIAGNOSIS — M9903 Segmental and somatic dysfunction of lumbar region: Secondary | ICD-10-CM | POA: Diagnosis not present

## 2021-04-02 DIAGNOSIS — M9904 Segmental and somatic dysfunction of sacral region: Secondary | ICD-10-CM | POA: Diagnosis not present

## 2021-04-02 DIAGNOSIS — M9903 Segmental and somatic dysfunction of lumbar region: Secondary | ICD-10-CM | POA: Diagnosis not present

## 2021-04-02 DIAGNOSIS — M5441 Lumbago with sciatica, right side: Secondary | ICD-10-CM | POA: Diagnosis not present

## 2021-04-02 DIAGNOSIS — M4728 Other spondylosis with radiculopathy, sacral and sacrococcygeal region: Secondary | ICD-10-CM | POA: Diagnosis not present

## 2021-04-07 DIAGNOSIS — M4728 Other spondylosis with radiculopathy, sacral and sacrococcygeal region: Secondary | ICD-10-CM | POA: Diagnosis not present

## 2021-04-07 DIAGNOSIS — M9903 Segmental and somatic dysfunction of lumbar region: Secondary | ICD-10-CM | POA: Diagnosis not present

## 2021-04-07 DIAGNOSIS — M5441 Lumbago with sciatica, right side: Secondary | ICD-10-CM | POA: Diagnosis not present

## 2021-04-07 DIAGNOSIS — M9904 Segmental and somatic dysfunction of sacral region: Secondary | ICD-10-CM | POA: Diagnosis not present

## 2021-04-09 DIAGNOSIS — M9903 Segmental and somatic dysfunction of lumbar region: Secondary | ICD-10-CM | POA: Diagnosis not present

## 2021-04-09 DIAGNOSIS — M5441 Lumbago with sciatica, right side: Secondary | ICD-10-CM | POA: Diagnosis not present

## 2021-04-09 DIAGNOSIS — M9904 Segmental and somatic dysfunction of sacral region: Secondary | ICD-10-CM | POA: Diagnosis not present

## 2021-04-09 DIAGNOSIS — M4728 Other spondylosis with radiculopathy, sacral and sacrococcygeal region: Secondary | ICD-10-CM | POA: Diagnosis not present

## 2021-04-14 DIAGNOSIS — M4728 Other spondylosis with radiculopathy, sacral and sacrococcygeal region: Secondary | ICD-10-CM | POA: Diagnosis not present

## 2021-04-14 DIAGNOSIS — M9904 Segmental and somatic dysfunction of sacral region: Secondary | ICD-10-CM | POA: Diagnosis not present

## 2021-04-14 DIAGNOSIS — M9903 Segmental and somatic dysfunction of lumbar region: Secondary | ICD-10-CM | POA: Diagnosis not present

## 2021-04-14 DIAGNOSIS — M5441 Lumbago with sciatica, right side: Secondary | ICD-10-CM | POA: Diagnosis not present

## 2021-04-21 DIAGNOSIS — M4728 Other spondylosis with radiculopathy, sacral and sacrococcygeal region: Secondary | ICD-10-CM | POA: Diagnosis not present

## 2021-04-21 DIAGNOSIS — M9903 Segmental and somatic dysfunction of lumbar region: Secondary | ICD-10-CM | POA: Diagnosis not present

## 2021-04-21 DIAGNOSIS — M5441 Lumbago with sciatica, right side: Secondary | ICD-10-CM | POA: Diagnosis not present

## 2021-04-21 DIAGNOSIS — M9904 Segmental and somatic dysfunction of sacral region: Secondary | ICD-10-CM | POA: Diagnosis not present

## 2021-04-28 DIAGNOSIS — M5441 Lumbago with sciatica, right side: Secondary | ICD-10-CM | POA: Diagnosis not present

## 2021-04-28 DIAGNOSIS — M9904 Segmental and somatic dysfunction of sacral region: Secondary | ICD-10-CM | POA: Diagnosis not present

## 2021-04-28 DIAGNOSIS — M9903 Segmental and somatic dysfunction of lumbar region: Secondary | ICD-10-CM | POA: Diagnosis not present

## 2021-04-28 DIAGNOSIS — M4728 Other spondylosis with radiculopathy, sacral and sacrococcygeal region: Secondary | ICD-10-CM | POA: Diagnosis not present

## 2021-05-11 DIAGNOSIS — H2513 Age-related nuclear cataract, bilateral: Secondary | ICD-10-CM | POA: Diagnosis not present

## 2021-05-11 DIAGNOSIS — H52203 Unspecified astigmatism, bilateral: Secondary | ICD-10-CM | POA: Diagnosis not present

## 2021-05-11 DIAGNOSIS — H5203 Hypermetropia, bilateral: Secondary | ICD-10-CM | POA: Diagnosis not present

## 2021-05-12 DIAGNOSIS — M9903 Segmental and somatic dysfunction of lumbar region: Secondary | ICD-10-CM | POA: Diagnosis not present

## 2021-05-12 DIAGNOSIS — M9904 Segmental and somatic dysfunction of sacral region: Secondary | ICD-10-CM | POA: Diagnosis not present

## 2021-05-12 DIAGNOSIS — M4728 Other spondylosis with radiculopathy, sacral and sacrococcygeal region: Secondary | ICD-10-CM | POA: Diagnosis not present

## 2021-05-12 DIAGNOSIS — M5441 Lumbago with sciatica, right side: Secondary | ICD-10-CM | POA: Diagnosis not present

## 2021-05-26 DIAGNOSIS — M5441 Lumbago with sciatica, right side: Secondary | ICD-10-CM | POA: Diagnosis not present

## 2021-05-26 DIAGNOSIS — M4728 Other spondylosis with radiculopathy, sacral and sacrococcygeal region: Secondary | ICD-10-CM | POA: Diagnosis not present

## 2021-05-26 DIAGNOSIS — M9903 Segmental and somatic dysfunction of lumbar region: Secondary | ICD-10-CM | POA: Diagnosis not present

## 2021-05-26 DIAGNOSIS — M9904 Segmental and somatic dysfunction of sacral region: Secondary | ICD-10-CM | POA: Diagnosis not present

## 2021-06-16 DIAGNOSIS — M4728 Other spondylosis with radiculopathy, sacral and sacrococcygeal region: Secondary | ICD-10-CM | POA: Diagnosis not present

## 2021-06-16 DIAGNOSIS — M5441 Lumbago with sciatica, right side: Secondary | ICD-10-CM | POA: Diagnosis not present

## 2021-06-16 DIAGNOSIS — M9904 Segmental and somatic dysfunction of sacral region: Secondary | ICD-10-CM | POA: Diagnosis not present

## 2021-06-16 DIAGNOSIS — M9903 Segmental and somatic dysfunction of lumbar region: Secondary | ICD-10-CM | POA: Diagnosis not present

## 2021-06-30 DIAGNOSIS — M4728 Other spondylosis with radiculopathy, sacral and sacrococcygeal region: Secondary | ICD-10-CM | POA: Diagnosis not present

## 2021-06-30 DIAGNOSIS — M9903 Segmental and somatic dysfunction of lumbar region: Secondary | ICD-10-CM | POA: Diagnosis not present

## 2021-06-30 DIAGNOSIS — M5441 Lumbago with sciatica, right side: Secondary | ICD-10-CM | POA: Diagnosis not present

## 2021-06-30 DIAGNOSIS — M9904 Segmental and somatic dysfunction of sacral region: Secondary | ICD-10-CM | POA: Diagnosis not present

## 2021-07-14 DIAGNOSIS — M9903 Segmental and somatic dysfunction of lumbar region: Secondary | ICD-10-CM | POA: Diagnosis not present

## 2021-07-14 DIAGNOSIS — M5441 Lumbago with sciatica, right side: Secondary | ICD-10-CM | POA: Diagnosis not present

## 2021-07-14 DIAGNOSIS — M9904 Segmental and somatic dysfunction of sacral region: Secondary | ICD-10-CM | POA: Diagnosis not present

## 2021-07-14 DIAGNOSIS — M4728 Other spondylosis with radiculopathy, sacral and sacrococcygeal region: Secondary | ICD-10-CM | POA: Diagnosis not present

## 2021-07-23 ENCOUNTER — Other Ambulatory Visit: Payer: Self-pay | Admitting: Physician Assistant

## 2021-07-23 DIAGNOSIS — F3341 Major depressive disorder, recurrent, in partial remission: Secondary | ICD-10-CM

## 2021-07-23 DIAGNOSIS — F411 Generalized anxiety disorder: Secondary | ICD-10-CM

## 2021-07-23 NOTE — Telephone Encounter (Signed)
Please call pt to schedule appt with Jade.  No further refills until pt is seen.  T. Martavius Lusty, CMA  

## 2021-07-23 NOTE — Telephone Encounter (Signed)
LVM for patient to call back to get this f/u appt schedule for any further refills. AM

## 2021-07-29 ENCOUNTER — Other Ambulatory Visit: Payer: Self-pay

## 2021-07-29 ENCOUNTER — Ambulatory Visit: Payer: BC Managed Care – PPO | Admitting: Physician Assistant

## 2021-07-29 ENCOUNTER — Encounter: Payer: Self-pay | Admitting: Physician Assistant

## 2021-07-29 VITALS — BP 115/69 | HR 65 | Ht 69.0 in | Wt 215.0 lb

## 2021-07-29 DIAGNOSIS — Z131 Encounter for screening for diabetes mellitus: Secondary | ICD-10-CM

## 2021-07-29 DIAGNOSIS — F3341 Major depressive disorder, recurrent, in partial remission: Secondary | ICD-10-CM | POA: Diagnosis not present

## 2021-07-29 DIAGNOSIS — Z125 Encounter for screening for malignant neoplasm of prostate: Secondary | ICD-10-CM

## 2021-07-29 DIAGNOSIS — Z1329 Encounter for screening for other suspected endocrine disorder: Secondary | ICD-10-CM

## 2021-07-29 DIAGNOSIS — E782 Mixed hyperlipidemia: Secondary | ICD-10-CM | POA: Diagnosis not present

## 2021-07-29 DIAGNOSIS — Z79899 Other long term (current) drug therapy: Secondary | ICD-10-CM

## 2021-07-29 DIAGNOSIS — N400 Enlarged prostate without lower urinary tract symptoms: Secondary | ICD-10-CM

## 2021-07-29 DIAGNOSIS — F411 Generalized anxiety disorder: Secondary | ICD-10-CM

## 2021-07-29 MED ORDER — SERTRALINE HCL 100 MG PO TABS
200.0000 mg | ORAL_TABLET | Freq: Every day | ORAL | 3 refills | Status: DC
Start: 2021-07-29 — End: 2022-07-21

## 2021-07-29 MED ORDER — ATORVASTATIN CALCIUM 40 MG PO TABS: 40.0000 mg | ORAL_TABLET | Freq: Every day | ORAL | 3 refills | Status: DC

## 2021-07-29 MED ORDER — ALPRAZOLAM 0.5 MG PO TABS: 0.5000 mg | ORAL_TABLET | Freq: Every day | ORAL | 1 refills | Status: DC | PRN

## 2021-07-29 NOTE — Progress Notes (Signed)
Subjective:    Patient ID: Shawn Lloyd, male    DOB: 11-30-1960, 60 y.o.   MRN: [id number]  HPI Pt is a 60 yo male with MDD, Anxiety, HLD who presents to the clinic for medication refills.   Pt is doing well with mood. He has had a lot of stress recently but done well handling it. 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 Taking zoloft and as needed xanax. Needs refills. No SI/HC.    Marland Kitchen Active Ambulatory Problems    Diagnosis Date Noted   NAUSEA 04/20/2009   RUQ PAIN 04/20/2009   GAD (generalized anxiety disorder) 02/03/2012   MDD (major depressive disorder) 02/03/2012   Hyperlipidemia 10/09/2014   Burning pain 10/15/2014   Numbness in both legs 10/15/2014   Right leg pain 11/18/2014   BPH without urinary obstruction 07/19/2016   Enlarged prostate 07/19/2016   Suicidal ideation 07/19/2016   Acute right-sided low back pain with right-sided sciatica 09/30/2016   SI (sacroiliac) joint inflammation (HCC) 09/30/2016   Primary osteoarthritis of right shoulder 01/17/2018   Right hand weakness 06/17/2019   Numbness and tingling in right hand 06/17/2019   Abrasion of multiple sites of right hand and finger 03/14/2020   DDD (degenerative disc disease), cervical 07/03/2020   Chest tightness 07/03/2020   Resolved Ambulatory Problems    Diagnosis Date Noted   Diarrhea 04/20/2009   Cellulitis of right lower extremity 11/18/2014   Past Medical History:  Diagnosis Date   Anxiety    Depression    Hyperlipemia    Mood disorder (HCC)       Review of Systems  All other systems reviewed and are negative.     Objective:   Physical Exam Vitals reviewed.  Constitutional:      Appearance: Normal appearance. He is obese.  HENT:     Head: Normocephalic.  Neck:     Vascular: No carotid bruit.  Cardiovascular:     Rate and Rhythm: Normal rate and regular rhythm.     Pulses: Normal pulses.     Heart sounds: Normal heart sounds.  Pulmonary:     Effort: Pulmonary effort is  normal.     Breath sounds: Normal breath sounds.  Neurological:     General: No focal deficit present.     Mental Status: He is alert and oriented to person, place, and time.  Psychiatric:        Mood and Affect: Mood normal.      .. Depression screen [facility] 2/9 07/29/2021 07/15/2020 06/15/2019 01/17/2018  Decreased Interest 0 1 0 2  Down, Depressed, Hopeless 0 0 0 1  PHQ - 2 Score 0 1 0 3  Altered sleeping 0 1 1 2   Tired, decreased energy 1 1 1 2   Change in appetite 1 1 0 2  Feeling bad or failure about yourself  0 0 0 0  Trouble concentrating 0 0 0 1  Moving slowly or fidgety/restless 0 0 0 1  Suicidal thoughts 0 0 0 0  PHQ-9 Score 2 4 2 11   Difficult doing work/chores Not difficult at all Not difficult at all Not difficult at all Not difficult at all   . GAD 7 : Generalized Anxiety Score 07/29/2021 07/15/2020 06/15/2019 01/17/2018  Nervous, Anxious, on Edge 0 1 0 1  Control/stop worrying 0 1 0 1  Worry too much - different things 0 1 0 2  Trouble relaxing 1 1 0 1  Restless 0 0 0 1  Easily annoyed  or irritable 1 1 0 2  Afraid - awful might happen 0 0 0 0  Total GAD 7 Score 2 5 0 8  Anxiety Difficulty Not difficult at all Not difficult at all Not difficult at all Not difficult at all   .Marland Kitchen  RO-AUA SYMPTOM     Row Name 07/29/21 1000         During the last Month   Sensation of Bladder not Empty Less than 1 time in 5     Urinate<2 hours after last Not at all     Mult. stop/start when voiding Not at all     Difficult to postpone voiding Not at all     Weak urinary stream Less than 1 time in 5     Push/strain to begin urination Less than 1 time in 5     Times per night up to urinate Less than 1 time in 5       OTHER   Total Score 4                   Assessment & Plan:  .Marland KitchenKhaza was seen today for follow-up.  Diagnoses and all orders for this visit:  Recurrent major depressive disorder, in partial remission (HCC) -     sertraline (ZOLOFT) 100 MG tablet;  Take 2 tablets (200 mg total) by mouth daily.  Medication management -     PSA -     TSH -     Lipid Panel w/reflex Direct LDL -     COMPLETE METABOLIC PANEL WITH GFR -     CBC with Differential/Platelet  Mixed hyperlipidemia -     Lipid Panel w/reflex Direct LDL -     atorvastatin (LIPITOR) 40 MG tablet; Take 1 tablet (40 mg total) by mouth daily.  Prostate cancer screening -     PSA  Enlarged prostate -     PSA  Thyroid disorder screen -     TSH  Diabetes mellitus screening -     COMPLETE METABOLIC PANEL WITH GFR  GAD (generalized anxiety disorder) -     sertraline (ZOLOFT) 100 MG tablet; Take 2 tablets (200 mg total) by mouth daily. -     ALPRAZolam (XANAX) 0.5 MG tablet; Take 1 tablet (0.5 mg total) by mouth daily as needed.   PHQ/GAD numbers look great.  Refilled zoloft and xanax.  Fasting labs ordered.  Lipitor refilled.  Reminded patient to schedule colonoscopy. He agreed.  AUA low. PSA ordered.  Flu shot UTD.  Declined shingles/pneumonia/covid vaccine.   Encouraged to work on weight loss about 15lbs.

## 2021-07-30 DIAGNOSIS — Z79899 Other long term (current) drug therapy: Secondary | ICD-10-CM | POA: Diagnosis not present

## 2021-07-30 DIAGNOSIS — Z131 Encounter for screening for diabetes mellitus: Secondary | ICD-10-CM | POA: Diagnosis not present

## 2021-07-30 DIAGNOSIS — N400 Enlarged prostate without lower urinary tract symptoms: Secondary | ICD-10-CM | POA: Diagnosis not present

## 2021-07-30 DIAGNOSIS — E782 Mixed hyperlipidemia: Secondary | ICD-10-CM | POA: Diagnosis not present

## 2021-07-31 LAB — COMPLETE METABOLIC PANEL WITH GFR
AG Ratio: 1.6 (calc) (ref 1.0–2.5)
ALT: 33 U/L (ref 9–46)
AST: 25 U/L (ref 10–35)
Albumin: 4.4 g/dL (ref 3.6–5.1)
Alkaline phosphatase (APISO): 68 U/L (ref 35–144)
BUN: 19 mg/dL (ref 7–25)
CO2: 26 mmol/L (ref 20–32)
Calcium: 9.1 mg/dL (ref 8.6–10.3)
Chloride: 106 mmol/L (ref 98–110)
Creat: 0.96 mg/dL (ref 0.70–1.35)
Globulin: 2.7 g/dL (calc) (ref 1.9–3.7)
Glucose, Bld: 91 mg/dL (ref 65–99)
Potassium: 4.6 mmol/L (ref 3.5–5.3)
Sodium: 141 mmol/L (ref 135–146)
Total Bilirubin: 0.7 mg/dL (ref 0.2–1.2)
Total Protein: 7.1 g/dL (ref 6.1–8.1)
eGFR: 90 mL/min/{1.73_m2} (ref >=60)

## 2021-07-31 LAB — CBC WITH DIFFERENTIAL/PLATELET
Absolute Monocytes: 566 cells/uL (ref 200–950)
Basophils Absolute: 60 cells/uL (ref 0–200)
Basophils Relative: 1.3 %
Eosinophils Absolute: 221 cells/uL (ref 15–500)
Eosinophils Relative: 4.8 %
HCT: 42.8 % (ref 38.5–50.0)
Hemoglobin: 14.4 g/dL (ref 13.2–17.1)
Lymphs Abs: 1426 cells/uL (ref 850–3900)
MCH: 30.6 pg (ref 27.0–33.0)
MCHC: 33.6 g/dL (ref 32.0–36.0)
MCV: 91.1 fL (ref 80.0–100.0)
MPV: 11 fL (ref 7.5–12.5)
Monocytes Relative: 12.3 %
Neutro Abs: 2328 cells/uL (ref 1500–7800)
Neutrophils Relative %: 50.6 %
Platelets: 177 10*3/uL (ref 140–400)
RBC: 4.7 10*6/uL (ref 4.20–5.80)
RDW: 12.5 % (ref 11.0–15.0)
Total Lymphocyte: 31 %
WBC: 4.6 10*3/uL (ref 3.8–10.8)

## 2021-07-31 LAB — LIPID PANEL W/REFLEX DIRECT LDL
Cholesterol: 115 mg/dL (ref <200)
HDL: 41 mg/dL (ref >=40)
LDL Cholesterol (Calc): 60 mg/dL (calc)
Non-HDL Cholesterol (Calc): 74 mg/dL (calc) (ref <130)
Total CHOL/HDL Ratio: 2.8 (calc) (ref <5.0)
Triglycerides: 49 mg/dL (ref <150)

## 2021-07-31 LAB — TSH: TSH: 0.94 mIU/L (ref 0.40–4.50)

## 2021-07-31 LAB — PSA: PSA: 0.31 ng/mL (ref <=4.00)

## 2021-07-31 NOTE — Progress Notes (Signed)
Shawn Lloyd,   PSA stable and low.  Thyroid looks good.  Cholesterol looks great.  Kidney, liver, glucose looks wonderful.

## 2021-09-18 ENCOUNTER — Telehealth: Payer: Self-pay | Admitting: Neurology

## 2021-09-18 NOTE — Telephone Encounter (Signed)
Mychart message sent to patient.

## 2021-10-26 DIAGNOSIS — D125 Benign neoplasm of sigmoid colon: Secondary | ICD-10-CM | POA: Diagnosis not present

## 2021-10-26 DIAGNOSIS — Z1211 Encounter for screening for malignant neoplasm of colon: Secondary | ICD-10-CM | POA: Diagnosis not present

## 2021-10-27 LAB — HM COLONOSCOPY

## 2021-10-28 ENCOUNTER — Encounter: Payer: Self-pay | Admitting: Physician Assistant

## 2022-07-21 ENCOUNTER — Telehealth: Payer: Self-pay | Admitting: Physician Assistant

## 2022-07-21 DIAGNOSIS — F3341 Major depressive disorder, recurrent, in partial remission: Secondary | ICD-10-CM

## 2022-07-21 DIAGNOSIS — F411 Generalized anxiety disorder: Secondary | ICD-10-CM

## 2022-07-21 MED ORDER — SERTRALINE HCL 100 MG PO TABS: 200.0000 mg | ORAL_TABLET | Freq: Every day | ORAL | 0 refills | Status: DC

## 2022-07-21 NOTE — Telephone Encounter (Signed)
Refill sent. Patient needs appt for further refills. Thanks.

## 2022-07-21 NOTE — Telephone Encounter (Signed)
Patient called to request refills on Sertraline no more left

## 2022-07-23 DIAGNOSIS — M25552 Pain in left hip: Secondary | ICD-10-CM | POA: Diagnosis not present

## 2022-10-17 ENCOUNTER — Other Ambulatory Visit: Payer: Self-pay | Admitting: Physician Assistant

## 2022-10-17 DIAGNOSIS — E782 Mixed hyperlipidemia: Secondary | ICD-10-CM

## 2022-11-13 ENCOUNTER — Other Ambulatory Visit: Payer: Self-pay | Admitting: Physician Assistant

## 2022-11-13 DIAGNOSIS — E782 Mixed hyperlipidemia: Secondary | ICD-10-CM

## 2022-11-17 ENCOUNTER — Other Ambulatory Visit: Payer: Self-pay | Admitting: Physician Assistant

## 2022-11-17 DIAGNOSIS — E782 Mixed hyperlipidemia: Secondary | ICD-10-CM

## 2022-11-23 ENCOUNTER — Ambulatory Visit: Payer: BC Managed Care – PPO | Admitting: Family Medicine

## 2022-11-23 ENCOUNTER — Encounter: Payer: Self-pay | Admitting: Family Medicine

## 2022-11-23 VITALS — BP 116/69 | HR 66 | Ht 69.0 in | Wt 216.0 lb

## 2022-11-23 DIAGNOSIS — M62838 Other muscle spasm: Secondary | ICD-10-CM | POA: Diagnosis not present

## 2022-11-23 DIAGNOSIS — R6884 Jaw pain: Secondary | ICD-10-CM | POA: Insufficient documentation

## 2022-11-23 DIAGNOSIS — H9202 Otalgia, left ear: Secondary | ICD-10-CM | POA: Diagnosis not present

## 2022-11-23 MED ORDER — CYCLOBENZAPRINE HCL 5 MG PO TABS: 5.0000 mg | ORAL_TABLET | Freq: Two times a day (BID) | ORAL | 0 refills | Status: DC | PRN

## 2022-11-23 NOTE — Progress Notes (Signed)
Acute Office Visit  Subjective:     Patient ID: Shawn Lloyd, male    DOB: Aug 07, 1961, 62 y.o.   MRN: [id number]  Chief Complaint  Patient presents with   Jaw Pain   Ear Pain    HPI Patient is in today for jaw and ear pain that has been present for two weeks. Pt is also experiencing diarrhea. Two weeks ago he had issues opening his jaw on the left side and he couldn't eat. He did have a 62 year old in bed with him and was wondering if he kicked him in the middle of the night. He treated with ibuprofen and it helped some. He has some residual neck tightness and ear pain. He is up to date on tetanus.   Nausea and loose stool started last week and has improved since.   Review of Systems  Constitutional:  Negative for chills and fever.  Respiratory:  Negative for cough and shortness of breath.   Cardiovascular:  Negative for chest pain.  Neurological:  Negative for headaches.        Objective:    BP 116/69 (BP Location: Left Arm, Patient Position: Sitting, Cuff Size: Normal)   Pulse 66   Ht 5\' 9"  (1.753 m)   Wt 216 lb (98 kg)   SpO2 99%   BMI 31.90 kg/m    Physical Exam Vitals and nursing note reviewed.  Constitutional:      General: He is not in acute distress.    Appearance: Normal appearance.  HENT:     Head: Normocephalic and atraumatic.     Comments: Normal ROM of neck    Right Ear: Tympanic membrane, ear canal and external ear normal.     Left Ear: Tympanic membrane, ear canal and external ear normal.     Nose: Nose normal.  Eyes:     Conjunctiva/sclera: Conjunctivae normal.  Cardiovascular:     Rate and Rhythm: Normal rate.  Pulmonary:     Effort: Pulmonary effort is normal.  Neurological:     General: No focal deficit present.     Mental Status: He is alert and oriented to person, place, and time.  Psychiatric:        Mood and Affect: Mood normal.        Behavior: Behavior normal.        Thought Content: Thought content normal.        Judgment:  Judgment normal.    No results found for any visits on 11/23/22.     Assessment & Plan:   Problem List Items Addressed This Visit       Other   Left ear pain    - ear exam is normal with good cone of light  - recommend following up with ENT as exam on my end is normal, however patient is feeling "like there is a bucket on the side of my head" and he has to sleep upright to get relief from ear pain       Jaw pain - Primary    - differential includes masseter muscle spasm vs TMJ  - will do a trial of flexeril to see if this improves pain       Muscle spasm    Meds ordered this encounter  Medications   cyclobenzaprine (FLEXERIL) 5 MG tablet    Sig: Take 1 tablet (5 mg total) by mouth 2 (two) times daily as needed for muscle spasms.    Dispense:  15 tablet  Refill:  0    No follow-ups on file.  Owens Loffler, DO

## 2022-11-23 NOTE — Assessment & Plan Note (Signed)
-   differential includes masseter muscle spasm vs TMJ  - will do a trial of flexeril to see if this improves pain

## 2022-11-23 NOTE — Assessment & Plan Note (Signed)
-   ear exam is normal with good cone of light  - recommend following up with ENT as exam on my end is normal, however patient is feeling "like there is a bucket on the side of my head" and he has to sleep upright to get relief from ear pain

## 2022-12-01 ENCOUNTER — Other Ambulatory Visit: Payer: Self-pay | Admitting: Physician Assistant

## 2022-12-01 DIAGNOSIS — E782 Mixed hyperlipidemia: Secondary | ICD-10-CM

## 2022-12-01 NOTE — Telephone Encounter (Signed)
Spoke with patient. He will call back to schedule a visit with Tandy Gaw, PA. He states he is currently out of town.

## 2022-12-01 NOTE — Telephone Encounter (Signed)
Spoke with patient. Informed him that he has not been seen by primary care provider since 07/2021. And last lab work was drawn for lipid panel in 07/2021 as well. He mentioned that he will be having blood work in May for physical at [employer]. Informed him that he stiill needed a visit with Jade as well. He states he will call back to schedule this as he is out of town. At time of call.

## 2022-12-01 NOTE — Telephone Encounter (Signed)
Attempted call to patient -needs appt for refill. Last seen by Tandy Gaw on12/02/2021 and last lipid panel 07/31/2023. Patient was seen by Dr. Tamera Punt on 11/23/22 but this was a sick visit . Left voice mail message requesting a return call from patient.

## 2023-01-17 ENCOUNTER — Other Ambulatory Visit: Payer: Self-pay | Admitting: Physician Assistant

## 2023-01-17 DIAGNOSIS — F411 Generalized anxiety disorder: Secondary | ICD-10-CM

## 2023-01-17 DIAGNOSIS — F3341 Major depressive disorder, recurrent, in partial remission: Secondary | ICD-10-CM

## 2023-01-26 ENCOUNTER — Ambulatory Visit (INDEPENDENT_AMBULATORY_CARE_PROVIDER_SITE_OTHER): Payer: BC Managed Care – PPO

## 2023-01-26 ENCOUNTER — Other Ambulatory Visit: Payer: Self-pay | Admitting: Physician Assistant

## 2023-01-26 ENCOUNTER — Ambulatory Visit: Payer: BC Managed Care – PPO | Admitting: Physician Assistant

## 2023-01-26 VITALS — BP 132/83 | HR 77 | Ht 69.0 in | Wt 215.0 lb

## 2023-01-26 DIAGNOSIS — M79661 Pain in right lower leg: Secondary | ICD-10-CM | POA: Diagnosis not present

## 2023-01-26 DIAGNOSIS — E782 Mixed hyperlipidemia: Secondary | ICD-10-CM

## 2023-01-26 DIAGNOSIS — I809 Phlebitis and thrombophlebitis of unspecified site: Secondary | ICD-10-CM | POA: Insufficient documentation

## 2023-01-26 DIAGNOSIS — S83206A Unspecified tear of unspecified meniscus, current injury, right knee, initial encounter: Secondary | ICD-10-CM | POA: Insufficient documentation

## 2023-01-26 DIAGNOSIS — F3341 Major depressive disorder, recurrent, in partial remission: Secondary | ICD-10-CM | POA: Diagnosis not present

## 2023-01-26 DIAGNOSIS — I8001 Phlebitis and thrombophlebitis of superficial vessels of right lower extremity: Secondary | ICD-10-CM

## 2023-01-26 DIAGNOSIS — F411 Generalized anxiety disorder: Secondary | ICD-10-CM

## 2023-01-26 DIAGNOSIS — M7121 Synovial cyst of popliteal space [Baker], right knee: Secondary | ICD-10-CM

## 2023-01-26 DIAGNOSIS — R600 Localized edema: Secondary | ICD-10-CM

## 2023-01-26 DIAGNOSIS — R0602 Shortness of breath: Secondary | ICD-10-CM | POA: Diagnosis not present

## 2023-01-26 DIAGNOSIS — R079 Chest pain, unspecified: Secondary | ICD-10-CM

## 2023-01-26 DIAGNOSIS — R051 Acute cough: Secondary | ICD-10-CM | POA: Diagnosis not present

## 2023-01-26 DIAGNOSIS — M25561 Pain in right knee: Secondary | ICD-10-CM

## 2023-01-26 DIAGNOSIS — M79641 Pain in right hand: Secondary | ICD-10-CM

## 2023-01-26 DIAGNOSIS — M7989 Other specified soft tissue disorders: Secondary | ICD-10-CM

## 2023-01-26 DIAGNOSIS — M79642 Pain in left hand: Secondary | ICD-10-CM | POA: Diagnosis not present

## 2023-01-26 DIAGNOSIS — M25562 Pain in left knee: Secondary | ICD-10-CM

## 2023-01-26 DIAGNOSIS — M2550 Pain in unspecified joint: Secondary | ICD-10-CM | POA: Diagnosis not present

## 2023-01-26 DIAGNOSIS — M25461 Effusion, right knee: Secondary | ICD-10-CM | POA: Diagnosis not present

## 2023-01-26 MED ORDER — SERTRALINE HCL 100 MG PO TABS: 200.0000 mg | ORAL_TABLET | Freq: Every day | ORAL | 4 refills | Status: DC

## 2023-01-26 MED ORDER — MELOXICAM 15 MG PO TABS: 15.0000 mg | ORAL_TABLET | Freq: Every day | ORAL | 1 refills | Status: DC

## 2023-01-26 MED ORDER — PREDNISONE 50 MG PO TABS
ORAL_TABLET | ORAL | 0 refills | Status: DC
Start: 2023-01-26 — End: 2023-11-14

## 2023-01-26 NOTE — Patient Instructions (Signed)

## 2023-01-26 NOTE — Progress Notes (Signed)
Arthritis of both knees.

## 2023-01-26 NOTE — Progress Notes (Signed)
Acute Office Visit  Subjective:     Patient ID: Shawn Lloyd, male    DOB: 05-06-1961, 62 y.o.   MRN: [id number]  Chief Complaint  Patient presents with   Joint Pain    HPI Patient is in today for bilateral hand pain and swelling, right sided chest pain, cough and discomfort as well as right leg pain and swelling.   Pt just had his CPE on Friday and normal pulmonary function test, EKG and stress test.   Cough started about 5 days ago. No fever, chills, sinus pressure. Not taking anything for cough. Cough is dry.   Bilateral hand pain and swelling. No injury. Not tried anything to make better. Seems to be going on for 4-5 months.   Right leg is really bothering him. Hard to get up and down. Pain in calf. No SOB. Ongoing for over a month.   .. Active Ambulatory Problems    Diagnosis Date Noted   NAUSEA 04/20/2009   RUQ PAIN 04/20/2009   GAD (generalized anxiety disorder) 02/03/2012   MDD (major depressive disorder) 02/03/2012   Hyperlipidemia 10/09/2014   Burning pain 10/15/2014   Numbness in both legs 10/15/2014   Right leg pain 11/18/2014   BPH without urinary obstruction 07/19/2016   Enlarged prostate 07/19/2016   Suicidal ideation 07/19/2016   Acute right-sided low back pain with right-sided sciatica 09/30/2016   SI (sacroiliac) joint inflammation (HCC) 09/30/2016   Primary osteoarthritis of right shoulder 01/17/2018   Right hand weakness 06/17/2019   Numbness and tingling in right hand 06/17/2019   Abrasion of multiple sites of right hand and finger 03/14/2020   DDD (degenerative disc disease), cervical 07/03/2020   Chest tightness 07/03/2020   Left ear pain 11/23/2022   Jaw pain 11/23/2022   Muscle spasm 11/23/2022   Baker's cyst of knee, right 01/26/2023   Superficial thrombophlebitis 01/26/2023   Resolved Ambulatory Problems    Diagnosis Date Noted   Diarrhea 04/20/2009   Cellulitis of right lower extremity 11/18/2014   Past Medical History:  Diagnosis  Date   Anxiety    Depression    Hyperlipemia    Mood disorder (HCC)      ROS See HPI.      Objective:    BP 132/83   Pulse 77   Ht 5\' 9"  (1.753 m)   Wt 215 lb (97.5 kg)   SpO2 99%   BMI 31.75 kg/m  BP Readings from Last 3 Encounters:  01/26/23 132/83  11/23/22 116/69  07/29/21 115/69   Wt Readings from Last 3 Encounters:  01/26/23 215 lb (97.5 kg)  11/23/22 216 lb (98 kg)  07/29/21 215 lb (97.5 kg)      Physical Exam Constitutional:      Appearance: Normal appearance. He is obese.  HENT:     Head: Normocephalic.  Cardiovascular:     Rate and Rhythm: Normal rate and regular rhythm.  Pulmonary:     Effort: Pulmonary effort is normal.     Breath sounds: Normal breath sounds. No wheezing or rhonchi.  Musculoskeletal:     Right lower leg: Edema present.     Left lower leg: No edema.     Comments: Right calf tenderness but no erythema or warmth Swelling behind right knee  Bilateral hands diffusely swollen and tender.    Neurological:     General: No focal deficit present.     Mental Status: He is alert and oriented to person, place, and time.  Psychiatric:  Mood and Affect: Mood normal.          Assessment & Plan:  .Marland KitchenKeiston was seen today for joint pain.  Diagnoses and all orders for this visit:  Bilateral hand pain -     DG Hand Complete Left; Future -     DG Hand Complete Right; Future -     Sed Rate (ESR) -     C-reactive protein -     ANA Screen,IFA,Reflex Titer/Pattern,Reflex Mplx 11 Ab Cascade with IdentRA -     Cyclic citrul peptide antibody, IgG  GAD (generalized anxiety disorder) -     sertraline (ZOLOFT) 100 MG tablet; Take 2 tablets (200 mg total) by mouth daily. -     COMPLETE METABOLIC PANEL WITH GFR  Recurrent major depressive disorder, in partial remission (HCC) -     sertraline (ZOLOFT) 100 MG tablet; Take 2 tablets (200 mg total) by mouth daily.  Acute pain of right knee -     US Venous Img Lower Unilateral Right;  Future -     DG Knee 4 Views W/Patella Right; Future -     DG Knee 1-2 Views Left; Future -     predniSONE (DELTASONE) 50 MG tablet; One tab PO daily for 5 days.  Right leg swelling -     US Venous Img Lower Unilateral Right; Future -     DG Knee 4 Views W/Patella Right; Future -     TSH -     Brain natriuretic peptide  Arthralgia of multiple joints -     CK (Creatine Kinase) -     Sed Rate (ESR) -     C-reactive protein -     ANA Screen,IFA,Reflex Titer/Pattern,Reflex Mplx 11 Ab Cascade with IdentRA -     Cyclic citrul peptide antibody, IgG -     predniSONE (DELTASONE) 50 MG tablet; One tab PO daily for 5 days.  Mixed hyperlipidemia -     COMPLETE METABOLIC PANEL WITH GFR -     Lipid Panel w/reflex Direct LDL  Acute cough -     Brain natriuretic peptide   Reassured about right sided CP due to normal cardiac work up.   ? Bakers cyst/DVT due to right leg pain and swelling U/S ordered Bnp ordered  Labs to look at inflammation and RA Xrays of hands and right knee ordered Likely OA Prednisone burst given  Lungs sound good Cough likely allergic or viral  Prednisone burst given  Zoloft refilled for mood.   Tandy Gaw, PA-C

## 2023-01-26 NOTE — Progress Notes (Signed)
No DVT. Great news.  You do have superficial thrombophlebitis(blood clot) in right lesser saphenous vein which could explain the calf pain. Warm compresses and start wearing elastic compression socks. Start mobic 15mg  daily after finishing prednisone.  You also have bakers cyst in the posterior knee. Schedule appt with Dr. Karie Schwalbe to consider draining.

## 2023-01-26 NOTE — Progress Notes (Signed)
Moderate arthritis of both hands.

## 2023-01-27 LAB — COMPLETE METABOLIC PANEL WITH GFR
ALT: 21 U/L (ref 9–46)
AST: 18 U/L (ref 10–35)
Albumin: 4.5 g/dL (ref 3.6–5.1)
CO2: 24 mmol/L (ref 20–32)
Chloride: 103 mmol/L (ref 98–110)
Total Bilirubin: 0.7 mg/dL (ref 0.2–1.2)
Total Protein: 7.2 g/dL (ref 6.1–8.1)

## 2023-01-27 LAB — LIPID PANEL W/REFLEX DIRECT LDL
HDL: 45 mg/dL (ref >=40)
Triglycerides: 192 mg/dL — ABNORMAL HIGH (ref <150)

## 2023-01-27 LAB — BRAIN NATRIURETIC PEPTIDE: Brain Natriuretic Peptide: 10 pg/mL (ref <100)

## 2023-01-28 ENCOUNTER — Encounter: Payer: Self-pay | Admitting: Physician Assistant

## 2023-01-28 DIAGNOSIS — R079 Chest pain, unspecified: Secondary | ICD-10-CM | POA: Insufficient documentation

## 2023-01-28 NOTE — Progress Notes (Signed)
Cholesterol is not to goal. Overall CV risk is elevated and suggested that you start statin drug to help lower this risk. Thoughts?   Marland KitchenMarland KitchenThe 10-year ASCVD risk score (Arnett DK, et al., 2019) is: 12.5%   Values used to calculate the score:     Age: 62 years     Sex: Male     Is Non-Hispanic African American: No     Diabetic: No     Tobacco smoker: No     Systolic Blood Pressure: 132 mmHg     Is BP treated: No     HDL Cholesterol: 45 mg/dL     Total Cholesterol: 223 mg/dL

## 2023-01-31 LAB — ANA SCREEN,IFA,REFLEX TITER/PATTERN,REFLEX MPLX 11 AB CASCADE
Anti Nuclear Antibody (ANA): NEGATIVE
Cyclic Citrullin Peptide Ab: <16 UNITS
MUTATED CITRULLINATED VIMENTIN (MCV) AB: <20 U/mL (ref <20)
Rheumatoid fact SerPl-aCnc: <10 IU/mL (ref <14)

## 2023-01-31 LAB — COMPLETE METABOLIC PANEL WITH GFR
AG Ratio: 1.7 (calc) (ref 1.0–2.5)
Alkaline phosphatase (APISO): 71 U/L (ref 35–144)
BUN: 13 mg/dL (ref 7–25)
Calcium: 8.9 mg/dL (ref 8.6–10.3)
Creat: 0.93 mg/dL (ref 0.70–1.35)
Globulin: 2.7 g/dL (calc) (ref 1.9–3.7)
Glucose, Bld: 84 mg/dL (ref 65–99)
Potassium: 4.2 mmol/L (ref 3.5–5.3)
Sodium: 139 mmol/L (ref 135–146)
eGFR: 93 mL/min/{1.73_m2} (ref >=60)

## 2023-01-31 LAB — LIPID PANEL W/REFLEX DIRECT LDL
Cholesterol: 223 mg/dL — ABNORMAL HIGH (ref <200)
LDL Cholesterol (Calc): 145 mg/dL (calc) — ABNORMAL HIGH
Non-HDL Cholesterol (Calc): 178 mg/dL (calc) — ABNORMAL HIGH (ref <130)
Total CHOL/HDL Ratio: 5 (calc) — ABNORMAL HIGH (ref <5.0)

## 2023-01-31 LAB — CK: Total CK: 92 U/L (ref 44–196)

## 2023-01-31 LAB — TSH: TSH: 1.57 mIU/L (ref 0.40–4.50)

## 2023-01-31 LAB — SEDIMENTATION RATE: Sed Rate: 9 mm/h (ref 0–20)

## 2023-01-31 LAB — C-REACTIVE PROTEIN: CRP: 18.1 mg/L — ABNORMAL HIGH (ref <8.0)

## 2023-01-31 NOTE — Progress Notes (Signed)
ANA is negative indicating no autoimmune factors of joint pain and swelling.

## 2023-02-01 ENCOUNTER — Telehealth: Payer: Self-pay | Admitting: Physician Assistant

## 2023-02-01 MED ORDER — AZITHROMYCIN 250 MG PO TABS: ORAL_TABLET | ORAL | 0 refills | Status: DC

## 2023-02-01 NOTE — Telephone Encounter (Signed)
Cough worsening with some production. Added zpak to prednisone.

## 2023-02-03 ENCOUNTER — Other Ambulatory Visit (INDEPENDENT_AMBULATORY_CARE_PROVIDER_SITE_OTHER): Payer: BC Managed Care – PPO

## 2023-02-03 ENCOUNTER — Ambulatory Visit: Payer: BC Managed Care – PPO | Admitting: Sports Medicine

## 2023-02-03 DIAGNOSIS — M7121 Synovial cyst of popliteal space [Baker], right knee: Secondary | ICD-10-CM

## 2023-02-03 NOTE — Progress Notes (Signed)
    Procedures performed today:    Procedure: Real-time Ultrasound Guided aspiration/injection of right knee Baker's cyst Device: Samsung HS60  Verbal informed consent obtained.  Time-out conducted.  Noted no overlying erythema, induration, or other signs of local infection.  Skin prepped in a sterile fashion.  Local anesthesia: Topical Ethyl chloride.  With sterile technique and under real time ultrasound guidance: Noted multiseptated Baker's cyst, I advanced an 18-gauge needle into each of the septations, aspirating each, syringe switched and 1 cc Kenalog 40, 2 cc lidocaine, 2 cc bupivacaine injected easily Completed without difficulty  Advised to call if fevers/chills, erythema, induration, drainage, or persistent bleeding.  Images permanently stored and available for review in PACS.  Impression: Technically successful ultrasound guided aspiration/injection.  Independent interpretation of notes and tests performed by another provider:   None.  Brief History, Exam, Impression, and Recommendations:    Baker's cyst of knee, right Osteoarthritis on x-rays, uncomfortable Baker's cyst limiting motion, I do see it on exam, we did an aspiration and injection of the Baker's cyst, would like to see him back in 6 weeks.    ____________________________________________ Ihor Austin. Benjamin Stain, M.D., ABFM., CAQSM., AME. Primary Care and Sports Medicine [facility]  Adjunct Professor of Family Medicine  [employer]  Restaurant manager, fast food

## 2023-02-03 NOTE — Assessment & Plan Note (Signed)
Osteoarthritis on x-rays, uncomfortable Baker's cyst limiting motion, I do see it on exam, we did an aspiration and injection of the Baker's cyst, would like to see him back in 6 weeks.

## 2023-03-01 ENCOUNTER — Encounter: Payer: Self-pay | Admitting: Sports Medicine

## 2023-03-02 ENCOUNTER — Other Ambulatory Visit: Payer: Self-pay | Admitting: Physician Assistant

## 2023-03-02 DIAGNOSIS — E782 Mixed hyperlipidemia: Secondary | ICD-10-CM

## 2023-03-04 ENCOUNTER — Ambulatory Visit (INDEPENDENT_AMBULATORY_CARE_PROVIDER_SITE_OTHER): Payer: BC Managed Care – PPO | Admitting: Sports Medicine

## 2023-03-04 DIAGNOSIS — M7121 Synovial cyst of popliteal space [Baker], right knee: Secondary | ICD-10-CM

## 2023-03-04 MED ORDER — HYDROCODONE-ACETAMINOPHEN 10-325 MG PO TABS: 1.0000 | ORAL_TABLET | Freq: Three times a day (TID) | ORAL | 0 refills | Status: DC | PRN

## 2023-03-04 NOTE — Progress Notes (Signed)
    Procedures performed today:    None.  Independent interpretation of notes and tests performed by another provider:   None.  Brief History, Exam, Impression, and Recommendations:    Baker's cyst of knee, right This is a very pleasant 62 year old male, he has known knee osteoarthritis on x-rays, had an uncomfortable Baker's cyst limiting motion, we saw him back about a month ago, I aspirated and injected the Baker's cyst.  He did really well, he has no longer any discomfort or lack of motion from a posterior aspect, he was doing well until a recent flare of acute pain, he does have increasing pain medial joint line after trying to get out of his car. On exam he does have mild effusion, tenderness medial joint line, due to failure of conservative treatment we are to proceed with an MRI of the knee, this will help determine whether we proceed with viscosupplementation or referral for arthroscopy. Adding some hydrocodone for pain relief, a reaction knee brace. Return to see me for MRI results.    ____________________________________________ Ihor Austin. Benjamin Stain, M.D., ABFM., CAQSM., AME. Primary Care and Sports Medicine [facility]  Adjunct Professor of Family Medicine  [employer]  Restaurant manager, fast food

## 2023-03-04 NOTE — Assessment & Plan Note (Signed)
This is a very pleasant 62 year old male, he has known knee osteoarthritis on x-rays, had an uncomfortable Baker's cyst limiting motion, we saw him back about a month ago, I aspirated and injected the Baker's cyst.  He did really well, he has no longer any discomfort or lack of motion from a posterior aspect, he was doing well until a recent flare of acute pain, he does have increasing pain medial joint line after trying to get out of his car. On exam he does have mild effusion, tenderness medial joint line, due to failure of conservative treatment we are to proceed with an MRI of the knee, this will help determine whether we proceed with viscosupplementation or referral for arthroscopy. Adding some hydrocodone for pain relief, a reaction knee brace. Return to see me for MRI results.

## 2023-03-13 ENCOUNTER — Ambulatory Visit (INDEPENDENT_AMBULATORY_CARE_PROVIDER_SITE_OTHER): Payer: BC Managed Care – PPO

## 2023-03-13 DIAGNOSIS — M7121 Synovial cyst of popliteal space [Baker], right knee: Secondary | ICD-10-CM

## 2023-03-13 DIAGNOSIS — M948X6 Other specified disorders of cartilage, lower leg: Secondary | ICD-10-CM | POA: Diagnosis not present

## 2023-03-13 DIAGNOSIS — S83231A Complex tear of medial meniscus, current injury, right knee, initial encounter: Secondary | ICD-10-CM | POA: Diagnosis not present

## 2023-03-18 ENCOUNTER — Other Ambulatory Visit: Payer: Self-pay | Admitting: Sports Medicine

## 2023-03-18 DIAGNOSIS — M7121 Synovial cyst of popliteal space [Baker], right knee: Secondary | ICD-10-CM

## 2023-03-18 NOTE — Telephone Encounter (Signed)
Reqeusting rx rf of hydrocodone - acet 10-325  Last written 03/04/2023 Qty #15 Last OV 03/04/2023 Upcoming appt 03/24/2023   Patient comment: Have not herd anything back from MRI

## 2023-03-24 ENCOUNTER — Ambulatory Visit: Payer: BC Managed Care – PPO | Admitting: Sports Medicine

## 2023-03-24 DIAGNOSIS — M7121 Synovial cyst of popliteal space [Baker], right knee: Secondary | ICD-10-CM | POA: Diagnosis not present

## 2023-03-24 DIAGNOSIS — S83241D Other tear of medial meniscus, current injury, right knee, subsequent encounter: Secondary | ICD-10-CM

## 2023-03-24 MED ORDER — HYDROCODONE-ACETAMINOPHEN 10-325 MG PO TABS: 1.0000 | ORAL_TABLET | Freq: Three times a day (TID) | ORAL | 0 refills | Status: DC | PRN

## 2023-03-24 NOTE — Progress Notes (Signed)
    Procedures performed today:    None.  Independent interpretation of notes and tests performed by another provider:   None.  Brief History, Exam, Impression, and Recommendations:    Right knee medial meniscal tear with subchondral insufficiency fracture This is a very pleasant 62 year old male, he does have some knee osteoarthritis on x-rays, he had a painful/uncomfortable Baker's cyst limiting motion, I saw him back in June, we aspirated and injected the Baker's cyst, he initially did really well, he no longer had any pain or lack of motion from the posterior aspect but did have a recent acute flare of pain sometime in early July. On exam July 12 he had a mild effusion with tenderness medial joint line, we proceeded with MRI that did ultimately show medial meniscal tearing as well as a nondisplaced, nondisplaced subchondral insufficiency fracture. I went over the anatomy and pathophysiology, I do suspect at this juncture he will need arthroscopic debridement of his meniscal tear as well as potential subchondral cementing of his insufficiency fracture. Refilling his pain medication, he has worked with Dr. Corinna Capra and Dr.Starman in the past, we will send him back to Dr. Theophilus Bones.    ____________________________________________ Ihor Austin. Benjamin Stain, M.D., ABFM., CAQSM., AME. Primary Care and Sports Medicine [facility]  Adjunct Professor of Family Medicine  [employer]  Restaurant manager, fast food

## 2023-03-24 NOTE — Assessment & Plan Note (Signed)
This is a very pleasant 62 year old male, he does have some knee osteoarthritis on x-rays, he had a painful/uncomfortable Baker's cyst limiting motion, I saw him back in June, we aspirated and injected the Baker's cyst, he initially did really well, he no longer had any pain or lack of motion from the posterior aspect but did have a recent acute flare of pain sometime in early July. On exam July 12 he had a mild effusion with tenderness medial joint line, we proceeded with MRI that did ultimately show medial meniscal tearing as well as a nondisplaced, nondisplaced subchondral insufficiency fracture. I went over the anatomy and pathophysiology, I do suspect at this juncture he will need arthroscopic debridement of his meniscal tear as well as potential subchondral cementing of his insufficiency fracture. Refilling his pain medication, he has worked with Dr. Corinna Capra and Dr.Starman in the past, we will send him back to Dr. Theophilus Bones.

## 2023-03-30 ENCOUNTER — Other Ambulatory Visit: Payer: Self-pay | Admitting: Physician Assistant

## 2023-03-30 DIAGNOSIS — M25561 Pain in right knee: Secondary | ICD-10-CM | POA: Diagnosis not present

## 2023-04-07 DIAGNOSIS — M84361A Stress fracture, right tibia, initial encounter for fracture: Secondary | ICD-10-CM | POA: Diagnosis not present

## 2023-04-07 DIAGNOSIS — M94261 Chondromalacia, right knee: Secondary | ICD-10-CM | POA: Diagnosis not present

## 2023-04-07 DIAGNOSIS — G8918 Other acute postprocedural pain: Secondary | ICD-10-CM | POA: Diagnosis not present

## 2023-04-07 DIAGNOSIS — M23331 Other meniscus derangements, other medial meniscus, right knee: Secondary | ICD-10-CM | POA: Diagnosis not present

## 2023-04-07 DIAGNOSIS — M23321 Other meniscus derangements, posterior horn of medial meniscus, right knee: Secondary | ICD-10-CM | POA: Diagnosis not present

## 2023-04-07 DIAGNOSIS — S83241A Other tear of medial meniscus, current injury, right knee, initial encounter: Secondary | ICD-10-CM | POA: Diagnosis not present

## 2023-04-07 DIAGNOSIS — M25561 Pain in right knee: Secondary | ICD-10-CM | POA: Diagnosis not present

## 2023-04-07 DIAGNOSIS — M23300 Other meniscus derangements, unspecified lateral meniscus, right knee: Secondary | ICD-10-CM | POA: Diagnosis not present

## 2023-04-19 DIAGNOSIS — R2689 Other abnormalities of gait and mobility: Secondary | ICD-10-CM | POA: Diagnosis not present

## 2023-04-19 DIAGNOSIS — M25561 Pain in right knee: Secondary | ICD-10-CM | POA: Diagnosis not present

## 2023-04-27 DIAGNOSIS — R2689 Other abnormalities of gait and mobility: Secondary | ICD-10-CM | POA: Diagnosis not present

## 2023-04-27 DIAGNOSIS — M25561 Pain in right knee: Secondary | ICD-10-CM | POA: Diagnosis not present

## 2023-04-29 DIAGNOSIS — R2689 Other abnormalities of gait and mobility: Secondary | ICD-10-CM | POA: Diagnosis not present

## 2023-04-29 DIAGNOSIS — M25561 Pain in right knee: Secondary | ICD-10-CM | POA: Diagnosis not present

## 2023-05-03 DIAGNOSIS — R2689 Other abnormalities of gait and mobility: Secondary | ICD-10-CM | POA: Diagnosis not present

## 2023-05-03 DIAGNOSIS — M25561 Pain in right knee: Secondary | ICD-10-CM | POA: Diagnosis not present

## 2023-05-05 DIAGNOSIS — M25561 Pain in right knee: Secondary | ICD-10-CM | POA: Diagnosis not present

## 2023-05-05 DIAGNOSIS — R2689 Other abnormalities of gait and mobility: Secondary | ICD-10-CM | POA: Diagnosis not present

## 2023-05-10 DIAGNOSIS — R2689 Other abnormalities of gait and mobility: Secondary | ICD-10-CM | POA: Diagnosis not present

## 2023-05-10 DIAGNOSIS — M25561 Pain in right knee: Secondary | ICD-10-CM | POA: Diagnosis not present

## 2023-05-19 DIAGNOSIS — M25561 Pain in right knee: Secondary | ICD-10-CM | POA: Diagnosis not present

## 2023-05-19 DIAGNOSIS — R2689 Other abnormalities of gait and mobility: Secondary | ICD-10-CM | POA: Diagnosis not present

## 2023-05-24 DIAGNOSIS — M25561 Pain in right knee: Secondary | ICD-10-CM | POA: Diagnosis not present

## 2023-05-24 DIAGNOSIS — R2689 Other abnormalities of gait and mobility: Secondary | ICD-10-CM | POA: Diagnosis not present

## 2023-05-25 DIAGNOSIS — M25561 Pain in right knee: Secondary | ICD-10-CM | POA: Diagnosis not present

## 2023-05-25 DIAGNOSIS — R2689 Other abnormalities of gait and mobility: Secondary | ICD-10-CM | POA: Diagnosis not present

## 2023-05-30 DIAGNOSIS — R2689 Other abnormalities of gait and mobility: Secondary | ICD-10-CM | POA: Diagnosis not present

## 2023-05-30 DIAGNOSIS — M25561 Pain in right knee: Secondary | ICD-10-CM | POA: Diagnosis not present

## 2023-06-01 DIAGNOSIS — R2689 Other abnormalities of gait and mobility: Secondary | ICD-10-CM | POA: Diagnosis not present

## 2023-06-01 DIAGNOSIS — M25561 Pain in right knee: Secondary | ICD-10-CM | POA: Diagnosis not present

## 2023-11-14 ENCOUNTER — Ambulatory Visit
Admission: EM | Admit: 2023-11-14 | Discharge: 2023-11-14 | Disposition: A | Discharge status: Home / Self Care | Attending: Family Medicine | Admitting: Family Medicine

## 2023-11-14 ENCOUNTER — Other Ambulatory Visit: Payer: Self-pay

## 2023-11-14 ENCOUNTER — Telehealth: Payer: Self-pay

## 2023-11-14 ENCOUNTER — Encounter: Payer: Self-pay | Admitting: Physician Assistant

## 2023-11-14 DIAGNOSIS — F430 Acute stress reaction: Secondary | ICD-10-CM | POA: Diagnosis not present

## 2023-11-14 DIAGNOSIS — R0789 Other chest pain: Secondary | ICD-10-CM

## 2023-11-14 DIAGNOSIS — Z8659 Personal history of other mental and behavioral disorders: Secondary | ICD-10-CM

## 2023-11-14 DIAGNOSIS — F411 Generalized anxiety disorder: Secondary | ICD-10-CM

## 2023-11-14 MED ORDER — ALPRAZOLAM 0.5 MG PO TABS
0.5000 mg | ORAL_TABLET | Freq: Every day | ORAL | 1 refills | Status: AC | PRN
Start: 2023-11-14 — End: ?

## 2023-11-14 NOTE — Discharge Instructions (Addendum)
 See your usual PCP on Friday I have refilled your Xanax, 10 tablets, for as needed use

## 2023-11-14 NOTE — Telephone Encounter (Signed)
 Spoke with patient.  He had self scheduled an appt for 11/18/23 for "chest discomfort related to stress" States his his wife and mother have both been in hospital for the past 3 weeks causing increased stress.  States  wife has noticed that he is more short of breath even when first awakens in a.m. - constant chest tightness  and has some R shoulder pain .  Called and spoke with patient who I suggested go to urgent care so he could at least have EKG - he agreed  Will keep schld appt for  11/18/23 to discuss his request to increase strength of xanax due to increased stress.

## 2023-11-14 NOTE — ED Triage Notes (Signed)
 Called [facility] to get an increase of xanax, has been having a lot of anxiety. Referred by [name] to get an EKG since he is having chest tightness. Has had chest tightness for 2 weeks, which is constant in nature. No otc medications.

## 2023-11-14 NOTE — ED Provider Notes (Signed)
 [facility]    CSN: [id number] Arrival date & time: 11/14/23  1553      History   Chief Complaint Chief Complaint  Patient presents with   Chest Pain    HPI Shawn Lloyd is a 63 y.o. male.   HPI  Patient is here for a chest pressure sensation.  He does not have any history of cardiac disease.  No cardiac risk factors such as hypertension, diabetes, smoking, hyperlipidemia, or family history.  He does endorse stress.  States his wife was in the hospital having a major spine surgery.  His mother was in the hospital with pneumonia.  He then had to admit her to a rehab facility against her wishes.  He states that he is not sleeping well.  He had a prescription for Xanax that he did not fill.  It has expired.  He would like a another prescription for Xanax.  His primary care doctor is unable to see him until Friday.  The last prescription expired because he still rarely took it  Past Medical History:  Diagnosis Date   Anxiety    Depression    Hyperlipemia    Hyperlipidemia    Mood disorder [facility])     Patient Active Problem List   Diagnosis Date Noted   Right-sided chest pain 01/28/2023   Right knee medial meniscal tear with subchondral insufficiency fracture 01/26/2023   Superficial thrombophlebitis 01/26/2023   Muscle spasm 11/23/2022   DDD (degenerative disc disease), cervical 07/03/2020   Chest tightness 07/03/2020   Right hand weakness 06/17/2019   Numbness and tingling in right hand 06/17/2019   Primary osteoarthritis of right shoulder 01/17/2018   Acute right-sided low back pain with right-sided sciatica 09/30/2016   SI (sacroiliac) joint inflammation (HCC) 09/30/2016   BPH without urinary obstruction 07/19/2016   Enlarged prostate 07/19/2016   Suicidal ideation 07/19/2016   Hyperlipidemia 10/09/2014   GAD (generalized anxiety disorder) 02/03/2012   MDD (major depressive disorder) 02/03/2012   RUQ PAIN 04/20/2009    Past Surgical History:  Procedure  Laterality Date   left shoulder surgery     SHOULDER SURGERY         Home Medications    Prior to Admission medications   Medication Sig Start Date End Date Taking? Authorizing Provider  ALPRAZolam Prudy Feeler) 0.5 MG tablet Take 1 tablet (0.5 mg total) by mouth daily as needed. 11/14/23   Eustace Moore, MD  sertraline (ZOLOFT) 100 MG tablet Take 2 tablets (200 mg total) by mouth daily. 01/26/23   Jomarie Longs, PA-C    Family History Family History  Problem Relation Age of Onset   Bipolar disorder Mother    Heart failure Mother    Hypertension Mother    Cancer - Other Father        prostate/lung/bladder/throat   Cancer Father        throat, prostate   Hypertension Father    Hyperlipidemia Father     Social History Social History   Tobacco Use   Smoking status: Never   Smokeless tobacco: Never  Vaping Use   Vaping status: Never Used  Substance Use Topics   Alcohol use: Not Currently   Drug use: No     Allergies   Lyrica [pregabalin]   Review of Systems Review of Systems See HPI  Physical Exam Triage Vital Signs ED Triage Vitals  Encounter Vitals Group     BP 11/14/23 1555 (!) 142/88     Systolic BP  Percentile --      Diastolic BP Percentile --      Pulse Rate 11/14/23 1555 74     Resp 11/14/23 1555 16     Temp 11/14/23 1555 97.8 F (36.6 C)     Temp src --      SpO2 11/14/23 1555 97 %     Weight --      Height --      Head Circumference --      Peak Flow --      Pain Score 11/14/23 1559 4     Pain Loc --      Pain Education --      Exclude from Growth Chart --    No data found.  Updated Vital Signs BP (!) 142/88   Pulse 74   Temp 97.8 F (36.6 C)   Resp 16   SpO2 97%      Physical Exam Constitutional:      General: He is not in acute distress.    Appearance: He is well-developed. He is not ill-appearing.  HENT:     Head: Normocephalic and atraumatic.  Eyes:     Conjunctiva/sclera: Conjunctivae normal.     Pupils: Pupils are  equal, round, and reactive to light.  Cardiovascular:     Rate and Rhythm: Normal rate and regular rhythm.     Heart sounds: Normal heart sounds.  Pulmonary:     Effort: Pulmonary effort is normal. No respiratory distress.     Breath sounds: Normal breath sounds.  Abdominal:     General: There is no distension.     Palpations: Abdomen is soft.  Musculoskeletal:        General: Normal range of motion.     Cervical back: Normal range of motion.     Right lower leg: No edema.     Left lower leg: No edema.  Skin:    General: Skin is warm and dry.  Neurological:     Mental Status: He is alert.      UC Treatments / Results  Labs (all labs ordered are listed, but only abnormal results are displayed) Labs Reviewed - No data to display  EKG   Radiology No results found.  Procedures Procedures (including critical care time)  Medications Ordered in UC Medications - No data to display  Initial Impression / Assessment and Plan / UC Course  I have reviewed the triage vital signs and the nursing notes.  Pertinent labs & imaging results that were available during my care of the patient were reviewed by me and considered in my medical decision making (see chart for details).     Exam is normal.  EKG is unchanged from prior.  Patient is given Xanax No. 10 to take sparingly.  He knows not to drive on the Xanax.  He does need help sleeping.  He will see his PCP on Friday Final Clinical Impressions(s) / UC Diagnoses   Final diagnoses:  Other chest pain  Stress reaction  History of anxiety     Discharge Instructions      See your usual PCP on Friday I have refilled your Xanax, 10 tablets, for as needed use   ED Prescriptions     Medication Sig Dispense Auth. Provider   ALPRAZolam Prudy Feeler) 0.5 MG tablet Take 1 tablet (0.5 mg total) by mouth daily as needed. 10 tablet Eustace Moore, MD      I have reviewed the PDMP during this encounter.  Eustace Moore,  MD 11/14/23 [phone]

## 2023-11-18 ENCOUNTER — Ambulatory Visit

## 2023-11-18 ENCOUNTER — Ambulatory Visit: Admitting: Physician Assistant

## 2023-11-18 VITALS — BP 120/82 | HR 82 | Ht 69.0 in | Wt 223.0 lb

## 2023-11-18 DIAGNOSIS — Z96612 Presence of left artificial shoulder joint: Secondary | ICD-10-CM | POA: Diagnosis not present

## 2023-11-18 DIAGNOSIS — R079 Chest pain, unspecified: Secondary | ICD-10-CM

## 2023-11-18 DIAGNOSIS — F430 Acute stress reaction: Secondary | ICD-10-CM

## 2023-11-18 DIAGNOSIS — M2550 Pain in unspecified joint: Secondary | ICD-10-CM

## 2023-11-18 DIAGNOSIS — Z96611 Presence of right artificial shoulder joint: Secondary | ICD-10-CM | POA: Diagnosis not present

## 2023-11-18 DIAGNOSIS — R0789 Other chest pain: Secondary | ICD-10-CM | POA: Diagnosis not present

## 2023-11-18 MED ORDER — CELECOXIB 200 MG PO CAPS
ORAL_CAPSULE | ORAL | 1 refills | Status: DC
Start: 2023-11-18 — End: 2023-12-22

## 2023-11-18 MED ORDER — DULOXETINE HCL 30 MG PO CPEP: 30.0000 mg | ORAL_CAPSULE | Freq: Every day | ORAL | 1 refills | Status: DC

## 2023-11-18 NOTE — Patient Instructions (Signed)
 Start celebrex 1-2 tablets daily for joint pain. Start cymbalta 1 tablet daily for pain and mood.

## 2023-11-18 NOTE — Progress Notes (Signed)
 Established Patient Office Visit  Subjective   Patient ID: Shawn Lloyd, male    DOB: 05-May-1961  Age: 63 y.o. MRN: [id number]  Chief Complaint  Patient presents with   Chest Pain    HPI Pt is a 63 yo male who presents to the clinic with chest pressure/pain for the last week persistently but really intermittently over last year. He does not have any hx of cardiac disease. He has had normal stress test in the past. He has no cardiac risk factors such as HTN, DM, smoking, HLD or family hx. He is very stressed. His wife and mother have been in hospital and he is worried about them both. He is not sleeping well. He denies any SOB or cough. He did go to UC on 11/14/23 and give a few xanax to use as needed. He does take zoloft daily. He does c/o overall body and joint aching. Work up in the past for RA negative. He does have a lot of OA.   Patient Active Problem List   Diagnosis Date Noted   Arthralgia of multiple joints 11/21/2023   Right-sided chest pain 01/28/2023   Right knee medial meniscal tear with subchondral insufficiency fracture 01/26/2023   Superficial thrombophlebitis 01/26/2023   Muscle spasm 11/23/2022   DDD (degenerative disc disease), cervical 07/03/2020   Chest tightness 07/03/2020   Right hand weakness 06/17/2019   Numbness and tingling in right hand 06/17/2019   Primary osteoarthritis of right shoulder 01/17/2018   Acute right-sided low back pain with right-sided sciatica 09/30/2016   SI (sacroiliac) joint inflammation (HCC) 09/30/2016   BPH without urinary obstruction 07/19/2016   Enlarged prostate 07/19/2016   Suicidal ideation 07/19/2016   Hyperlipidemia 10/09/2014   GAD (generalized anxiety disorder) 02/03/2012   MDD (major depressive disorder) 02/03/2012   RUQ PAIN 04/20/2009   Past Medical History:  Diagnosis Date   Anxiety    Depression    Hyperlipemia    Hyperlipidemia    Mood disorder (HCC)    Past Surgical History:  Procedure Laterality Date    left shoulder surgery     SHOULDER SURGERY     Family History  Problem Relation Age of Onset   Bipolar disorder Mother    Heart failure Mother    Hypertension Mother    Cancer - Other Father        prostate/lung/bladder/throat   Cancer Father        throat, prostate   Hypertension Father    Hyperlipidemia Father    Allergies  Allergen Reactions   Lyrica [Pregabalin] Other (See Comments)    Suicidal ideation     ROS See HPI.    Objective:     BP 120/82   Pulse 82   Ht 5\' 9"  (1.753 m)   Wt 223 lb (101.2 kg)   SpO2 99%   BMI 32.93 kg/m  BP Readings from Last 3 Encounters:  11/18/23 120/82  11/14/23 (!) 142/88  01/26/23 132/83   Wt Readings from Last 3 Encounters:  11/18/23 223 lb (101.2 kg)  01/26/23 215 lb (97.5 kg)  11/23/22 216 lb (98 kg)    ..    11/18/2023    9:18 AM 01/26/2023    1:24 PM 11/23/2022    8:53 AM 07/29/2021   10:22 AM 07/15/2020   10:14 AM  Depression screen PHQ 2/9  Decreased Interest 1 0 0 0 1  Down, Depressed, Hopeless 0 0 0 0 0  PHQ - 2 Score 1 0  0 0 1  Altered sleeping 1  0 0 1  Tired, decreased energy 1  1 1 1   Change in appetite 1   1 1   Feeling bad or failure about yourself  0  0 0 0  Trouble concentrating 0  0 0 0  Moving slowly or fidgety/restless 0  0 0 0  Suicidal thoughts 0  0 0 0  PHQ-9 Score 4  1 2 4   Difficult doing work/chores Not difficult at all  Not difficult at all Not difficult at all Not difficult at all   ..    11/18/2023    9:19 AM 07/29/2021   10:23 AM 07/15/2020   10:15 AM 06/15/2019   11:30 AM  GAD 7 : Generalized Anxiety Score  Nervous, Anxious, on Edge 1 0 1 0  Control/stop worrying 1 0 1 0  Worry too much - different things 1 0 1 0  Trouble relaxing 1 1 1  0  Restless 0 0 0 0  Easily annoyed or irritable 1 1 1  0  Afraid - awful might happen 0 0 0 0  Total GAD 7 Score 5 2 5  0  Anxiety Difficulty Not difficult at all Not difficult at all Not difficult at all Not difficult at all      Physical  Exam Constitutional:      Appearance: He is well-developed.  HENT:     Head: Normocephalic.  Eyes:     Pupils: Pupils are equal, round, and reactive to light.  Cardiovascular:     Rate and Rhythm: Normal rate and regular rhythm.     Pulses:          Carotid pulses are 0 on the right side and 0 on the left side.    Heart sounds: Normal heart sounds.  Pulmonary:     Effort: Pulmonary effort is normal.     Breath sounds: Normal breath sounds.  Musculoskeletal:     Cervical back: Normal range of motion.     Right lower leg: No edema.     Left lower leg: No edema.  Neurological:     General: No focal deficit present.     Mental Status: He is alert.  Psychiatric:        Mood and Affect: Mood normal.    EKG- no changes from prior EKG  The 10-year ASCVD risk score (Arnett DK, et al., 2019) is: 10.6%    Assessment & Plan:  .Marland KitchenShomari was seen today for chest pain.  Diagnoses and all orders for this visit:  Right-sided chest pain -     EKG 12-Lead -     DG Chest 2 View; Future  Arthralgia of multiple joints -     celecoxib (CELEBREX) 200 MG capsule; One to 2 tablets by mouth daily as needed for pain. -     DULoxetine (CYMBALTA) 30 MG capsule; Take 1 capsule (30 mg total) by mouth daily.  Stress reaction -     DULoxetine (CYMBALTA) 30 MG capsule; Take 1 capsule (30 mg total) by mouth daily.   I do not suspect symptoms are cardiac. (Pt has yearly stress test at work due to his job that have been normal) Vitals look GREAT.  LDL was elevated 9 months ago and statin was encouraged due to 10 year increased CV risk, pt declined EKG unchanged from previous Start cymbalta that could help with OA and pain and mood to add with zoloft Start celebrex for OA pain Use xanax as needed  sparingly Magnesium glycinate 400mg  at bedtime to help with sleep Follow up in 4 weeks     Return in about 6 weeks (around 12/30/2023).    Tandy Gaw, PA-C

## 2023-11-21 ENCOUNTER — Encounter: Payer: Self-pay | Admitting: Physician Assistant

## 2023-11-21 DIAGNOSIS — M2550 Pain in unspecified joint: Secondary | ICD-10-CM | POA: Insufficient documentation

## 2023-11-28 ENCOUNTER — Encounter: Payer: Self-pay | Admitting: Physician Assistant

## 2023-11-28 NOTE — Progress Notes (Signed)
 CXR looks good. Heart normal size. Lungs clear.

## 2023-12-11 ENCOUNTER — Other Ambulatory Visit: Payer: Self-pay | Admitting: Physician Assistant

## 2023-12-11 DIAGNOSIS — F430 Acute stress reaction: Secondary | ICD-10-CM

## 2023-12-11 DIAGNOSIS — M2550 Pain in unspecified joint: Secondary | ICD-10-CM

## 2023-12-20 ENCOUNTER — Other Ambulatory Visit: Payer: Self-pay | Admitting: Physician Assistant

## 2023-12-20 DIAGNOSIS — M2550 Pain in unspecified joint: Secondary | ICD-10-CM

## 2023-12-27 ENCOUNTER — Ambulatory Visit: Admitting: Physician Assistant

## 2023-12-27 ENCOUNTER — Encounter: Payer: Self-pay | Admitting: Physician Assistant

## 2023-12-27 VITALS — BP 136/75 | HR 70 | Ht 69.0 in | Wt 222.0 lb

## 2023-12-27 DIAGNOSIS — F411 Generalized anxiety disorder: Secondary | ICD-10-CM | POA: Diagnosis not present

## 2023-12-27 DIAGNOSIS — R079 Chest pain, unspecified: Secondary | ICD-10-CM | POA: Diagnosis not present

## 2023-12-27 DIAGNOSIS — M2550 Pain in unspecified joint: Secondary | ICD-10-CM

## 2023-12-27 DIAGNOSIS — F430 Acute stress reaction: Secondary | ICD-10-CM

## 2023-12-27 NOTE — Progress Notes (Signed)
 Established Patient Office Visit  Subjective   Patient ID: Shawn Lloyd, male    DOB: 02-08-1961  Age: 63 y.o. MRN: [id number]  Chief Complaint  Patient presents with   Medical Management of Chronic Issues    Right-sided chest pain, pt states he still feel tightness in his chest  and discomfort occupational therapy did help some, should and neck have subsided     HPI Pt is a 63 yo male who presents to the clinic to follow up on medications for anxiety, arthralgia, chest wall pain and stress. Pt does feel like overall he is doing better on celebrex , cymbalta  and zoloft . He is only using xanax  as needed and sparingly. He did not start magnesium  yet. His wife feels like he is doing much better.   Patient Active Problem List   Diagnosis Date Noted   Arthralgia of multiple joints 11/21/2023   Right-sided chest pain 01/28/2023   Right knee medial meniscal tear with subchondral insufficiency fracture 01/26/2023   Superficial thrombophlebitis 01/26/2023   Muscle spasm 11/23/2022   DDD (degenerative disc disease), cervical 07/03/2020   Chest tightness 07/03/2020   Right hand weakness 06/17/2019   Numbness and tingling in right hand 06/17/2019   Primary osteoarthritis of right shoulder 01/17/2018   Acute right-sided low back pain with right-sided sciatica 09/30/2016   SI (sacroiliac) joint inflammation (HCC) 09/30/2016   BPH without urinary obstruction 07/19/2016   Enlarged prostate 07/19/2016   Suicidal ideation 07/19/2016   Hyperlipidemia 10/09/2014   GAD (generalized anxiety disorder) 02/03/2012   MDD (major depressive disorder) 02/03/2012   RUQ PAIN 04/20/2009   Past Medical History:  Diagnosis Date   Anxiety    Depression    Hyperlipemia    Hyperlipidemia    Mood disorder (HCC)    Past Surgical History:  Procedure Laterality Date   left shoulder surgery     SHOULDER SURGERY     Family History  Problem Relation Age of Onset   Bipolar disorder Mother    Heart failure  Mother    Hypertension Mother    Cancer - Other Father        prostate/lung/bladder/throat   Cancer Father        throat, prostate   Hypertension Father    Hyperlipidemia Father    Allergies  Allergen Reactions   Lyrica  [Pregabalin ] Other (See Comments)    Suicidal ideation      ROS See HPI.    Objective:     BP 136/75   Pulse 70   Ht 5\' 9"  (1.753 m)   Wt 222 lb (100.7 kg)   SpO2 99%   BMI 32.78 kg/m  BP Readings from Last 3 Encounters:  12/27/23 136/75  11/18/23 120/82  11/14/23 (!) 142/88   Wt Readings from Last 3 Encounters:  12/27/23 222 lb (100.7 kg)  11/18/23 223 lb (101.2 kg)  01/26/23 215 lb (97.5 kg)    ..    12/27/2023   10:25 AM 11/18/2023    9:18 AM 01/26/2023    1:24 PM 11/23/2022    8:53 AM 07/29/2021   10:22 AM  Depression screen PHQ 2/9  Decreased Interest 0 1 0 0 0  Down, Depressed, Hopeless 0 0 0 0 0  PHQ - 2 Score 0 1 0 0 0  Altered sleeping 0 1  0 0  Tired, decreased energy 1 1  1 1   Change in appetite 0 1   1  Feeling bad or failure about yourself  0 0  0 0  Trouble concentrating 0 0  0 0  Moving slowly or fidgety/restless 0 0  0 0  Suicidal thoughts 0 0  0 0  PHQ-9 Score 1 4  1 2   Difficult doing work/chores Not difficult at all Not difficult at all  Not difficult at all Not difficult at all   ..    12/27/2023   10:25 AM 11/18/2023    9:19 AM 07/29/2021   10:23 AM 07/15/2020   10:15 AM  GAD 7 : Generalized Anxiety Score  Nervous, Anxious, on Edge 0 1 0 1  Control/stop worrying 0 1 0 1  Worry too much - different things 1 1 0 1  Trouble relaxing 0 1 1 1   Restless 0 0 0 0  Easily annoyed or irritable 0 1 1 1   Afraid - awful might happen 0 0 0 0  Total GAD 7 Score 1 5 2 5   Anxiety Difficulty Not difficult at all Not difficult at all Not difficult at all Not difficult at all      Physical Exam Constitutional:      Appearance: Normal appearance. He is obese.  HENT:     Head: Normocephalic.  Cardiovascular:     Rate and  Rhythm: Normal rate and regular rhythm.  Pulmonary:     Effort: Pulmonary effort is normal.     Breath sounds: Normal breath sounds.  Musculoskeletal:     Right lower leg: No edema.     Left lower leg: No edema.  Neurological:     General: No focal deficit present.     Mental Status: He is alert and oriented to person, place, and time.  Psychiatric:        Mood and Affect: Mood normal.      The 10-year ASCVD risk score (Arnett DK, et al., 2019) is: 13.1%    Assessment & Plan:  .Shawn Lloyd was seen today for medical management of chronic issues.  Diagnoses and all orders for this visit:  Arthralgia of multiple joints -     CMP14+EGFR  GAD (generalized anxiety disorder) -     CMP14+EGFR  Right-sided chest pain -     CMP14+EGFR  Stress reaction -     CMP14+EGFR   PHQ and GAD improved Cmp for medication management Continue celebrex , cymbalta , zoloft  and xanax  as needed Add magnesium  400mg  at bedtime Follow up in 6 months      Sandy Crumb, PA-C

## 2023-12-28 ENCOUNTER — Encounter: Payer: Self-pay | Admitting: Physician Assistant

## 2023-12-28 LAB — CMP14+EGFR
ALT: 31 IU/L (ref 0–44)
AST: 27 IU/L (ref 0–40)
Albumin: 4.5 g/dL (ref 3.9–4.9)
Alkaline Phosphatase: 73 IU/L (ref 44–121)
BUN/Creatinine Ratio: 19 (ref 10–24)
BUN: 19 mg/dL (ref 8–27)
Bilirubin Total: 0.5 mg/dL (ref 0.0–1.2)
CO2: 22 mmol/L (ref 20–29)
Calcium: 9.4 mg/dL (ref 8.6–10.2)
Chloride: 102 mmol/L (ref 96–106)
Creatinine, Ser: 1.01 mg/dL (ref 0.76–1.27)
Globulin, Total: 2.6 g/dL (ref 1.5–4.5)
Glucose: 90 mg/dL (ref 70–99)
Potassium: 4.9 mmol/L (ref 3.5–5.2)
Sodium: 139 mmol/L (ref 134–144)
Total Protein: 7.1 g/dL (ref 6.0–8.5)
eGFR: 84 mL/min/{1.73_m2} (ref >59)

## 2023-12-28 NOTE — Progress Notes (Signed)
 Kidney, liver, glucose look Good!

## 2023-12-30 ENCOUNTER — Ambulatory Visit: Admitting: Physician Assistant

## 2023-12-30 ENCOUNTER — Encounter: Payer: Self-pay | Admitting: Physician Assistant

## 2024-02-02 DIAGNOSIS — H903 Sensorineural hearing loss, bilateral: Secondary | ICD-10-CM | POA: Diagnosis not present

## 2024-02-09 DIAGNOSIS — M25511 Pain in right shoulder: Secondary | ICD-10-CM | POA: Diagnosis not present

## 2024-02-13 ENCOUNTER — Encounter: Payer: Self-pay | Admitting: Physician Assistant

## 2024-02-16 DIAGNOSIS — G8929 Other chronic pain: Secondary | ICD-10-CM | POA: Diagnosis not present

## 2024-02-16 DIAGNOSIS — Z96611 Presence of right artificial shoulder joint: Secondary | ICD-10-CM | POA: Diagnosis not present

## 2024-02-16 DIAGNOSIS — M25511 Pain in right shoulder: Secondary | ICD-10-CM | POA: Diagnosis not present

## 2024-02-23 DIAGNOSIS — M25511 Pain in right shoulder: Secondary | ICD-10-CM | POA: Diagnosis not present

## 2024-02-23 DIAGNOSIS — Z96611 Presence of right artificial shoulder joint: Secondary | ICD-10-CM | POA: Diagnosis not present

## 2024-02-23 DIAGNOSIS — G8929 Other chronic pain: Secondary | ICD-10-CM | POA: Diagnosis not present

## 2024-03-07 DIAGNOSIS — M25511 Pain in right shoulder: Secondary | ICD-10-CM | POA: Diagnosis not present

## 2024-03-10 ENCOUNTER — Other Ambulatory Visit: Payer: Self-pay | Admitting: Physician Assistant

## 2024-03-10 DIAGNOSIS — M2550 Pain in unspecified joint: Secondary | ICD-10-CM

## 2024-04-12 ENCOUNTER — Other Ambulatory Visit: Payer: Self-pay | Admitting: Physician Assistant

## 2024-04-12 DIAGNOSIS — F3341 Major depressive disorder, recurrent, in partial remission: Secondary | ICD-10-CM

## 2024-04-12 DIAGNOSIS — F411 Generalized anxiety disorder: Secondary | ICD-10-CM

## 2024-04-15 ENCOUNTER — Encounter: Payer: Self-pay | Admitting: Physician Assistant

## 2024-04-18 DIAGNOSIS — I2510 Atherosclerotic heart disease of native coronary artery without angina pectoris: Secondary | ICD-10-CM | POA: Diagnosis not present

## 2024-04-18 DIAGNOSIS — E785 Hyperlipidemia, unspecified: Secondary | ICD-10-CM | POA: Diagnosis not present

## 2024-04-18 DIAGNOSIS — Z01812 Encounter for preprocedural laboratory examination: Secondary | ICD-10-CM | POA: Diagnosis not present

## 2024-04-18 DIAGNOSIS — Z01818 Encounter for other preprocedural examination: Secondary | ICD-10-CM | POA: Diagnosis not present

## 2024-04-18 DIAGNOSIS — M25511 Pain in right shoulder: Secondary | ICD-10-CM | POA: Diagnosis not present

## 2024-04-18 DIAGNOSIS — F419 Anxiety disorder, unspecified: Secondary | ICD-10-CM | POA: Diagnosis not present

## 2024-04-18 DIAGNOSIS — D696 Thrombocytopenia, unspecified: Secondary | ICD-10-CM | POA: Diagnosis not present

## 2024-04-24 ENCOUNTER — Encounter: Payer: Self-pay | Admitting: Sports Medicine

## 2024-04-24 ENCOUNTER — Ambulatory Visit: Admitting: Physician Assistant

## 2024-04-24 ENCOUNTER — Encounter: Payer: Self-pay | Admitting: Physician Assistant

## 2024-04-24 VITALS — BP 133/76 | HR 77 | Ht 69.0 in | Wt 219.0 lb

## 2024-04-24 DIAGNOSIS — L219 Seborrheic dermatitis, unspecified: Secondary | ICD-10-CM | POA: Diagnosis not present

## 2024-04-24 MED ORDER — CLOTRIMAZOLE-BETAMETHASONE 1-0.05 % EX CREA
1.0000 | TOPICAL_CREAM | Freq: Two times a day (BID) | CUTANEOUS | 0 refills | Status: AC
Start: 2024-04-24 — End: ?

## 2024-04-24 NOTE — Progress Notes (Unsigned)
   Acute Office Visit  Subjective:     Patient ID: Shawn Lloyd, male    DOB: [birth year], 63 y.o.   MRN: [id number]  Chief Complaint  Patient presents with   Ear Pain    Right ear pain     HPI Patient is in today for right external ear itching and flakiness.  Seen   ROS See HPI.      Objective:    BP 133/76   Pulse 77   Ht 5' 9 (1.753 m)   Wt 219 lb (99.3 kg)   SpO2 99%   BMI 32.34 kg/m  BP Readings from Last 3 Encounters:  04/24/24 133/76  12/27/23 136/75  11/18/23 120/82   Wt Readings from Last 3 Encounters:  04/24/24 219 lb (99.3 kg)  12/27/23 222 lb (100.7 kg)  11/18/23 223 lb (101.2 kg)      Physical Exam       Assessment & Plan:  .[name] was seen today for ear pain.  Diagnoses and all orders for this visit:  Seborrheic dermatitis -     clotrimazole -betamethasone  (LOTRISONE ) cream; Apply 1 Application topically 2 (two) times daily. For next 7-10 days then as needed.    No follow-ups on file.  Maude Hettich, PA-C

## 2024-04-24 NOTE — Patient Instructions (Signed)
Seborrheic Dermatitis, Adult Seborrheic dermatitis is a skin disease that causes red, scaly patches. It often occurs on the scalp, where it may be called dandruff. The patches may also appear on other parts of the body. Skin patches tend to occur where there are a lot of oil glands in the skin. Areas of the body that may be affected include: The scalp. The face, eyebrows, and ears. The area around a beard. Skin folds of the body. This includes the armpits, groin, and buttocks. The chest. The condition is often long-lasting (chronic). It may come and go for no known reason. It may be activated by a trigger, such as: Cold weather. Being out in the sun. Stress. Drinking alcohol. What are the causes? The cause of this condition is not known. It may be related to having too much yeast on the skin or changes in how your body's disease-fighting system (immune system) works. What increases the risk? You may be more likely to develop this condition if: You have a weak immune system. You are 75 years old or older. You have other conditions, such as: Human immunodeficiency virus (HIV) or acquired immunodeficiency virus (AIDS). Parkinson's disease. Mood disorders, such as depression. Liver problems. Obesity. What are the signs or symptoms? Symptoms of this condition include: Thick scales on the scalp. Redness on the face or in the armpits. Skin that is flaky. The flakes may be white or yellow. Skin that seems oily or dry but is not helped with moisturizers. Itching or burning in the affected areas. How is this diagnosed? This condition is diagnosed with a medical history and physical exam. A sample of your skin may be tested (skin biopsy). You may need to see a skin specialist (dermatologist). How is this treated? There is no cure for this condition, but treatment can help to manage the symptoms. You may get treatment to remove scales, lower the risk of skin infection, and reduce swelling or  itching. Treatment may include: Medicated shampoos, moisturizing creams, or ointments. Creams that reduce skin yeast. Creams that reduce swelling and irritation (steroids). Follow these instructions at home: Skin care Use any medicated shampoo, skin creams, or ointments only as told by your health care provider. Do not use skin products that contain alcohol. Take lukewarm baths or showers. Avoid very hot water. When you are outside, wear a hat and clothes that block UV light. General instructions Apply over-the-counter and prescription medicines only as told by your health care provider. Learn what triggers your symptoms so you can avoid these things. Use techniques for stress reduction, such as meditation or yoga. Do not drink alcohol if your health care provider tells you not to drink. Keep all follow-up visits. Your health care provider will check your skin to make sure the treatments are helping. Where to find more information [employer]: [employer] Contact a health care provider if: Your symptoms do not get better with treatment. Your symptoms get worse. You have new symptoms. Get help right away if: Your condition quickly gets worse, even with treatment. This information is not intended to replace advice given to you by your health care provider. Make sure you discuss any questions you have with your health care provider. Document Revised: 01/08/2022 Document Reviewed: 01/08/2022 [employer] Patient Education  2024 [employer].

## 2024-04-25 ENCOUNTER — Encounter: Payer: Self-pay | Admitting: Physician Assistant

## 2024-04-26 DIAGNOSIS — H524 Presbyopia: Secondary | ICD-10-CM | POA: Diagnosis not present

## 2024-04-30 DIAGNOSIS — M25511 Pain in right shoulder: Secondary | ICD-10-CM | POA: Diagnosis not present

## 2024-05-03 DIAGNOSIS — T84098A Other mechanical complication of other internal joint prosthesis, initial encounter: Secondary | ICD-10-CM | POA: Diagnosis not present

## 2024-05-03 DIAGNOSIS — E669 Obesity, unspecified: Secondary | ICD-10-CM | POA: Diagnosis not present

## 2024-05-03 DIAGNOSIS — T84038A Mechanical loosening of other internal prosthetic joint, initial encounter: Secondary | ICD-10-CM | POA: Diagnosis not present

## 2024-05-03 DIAGNOSIS — Z96611 Presence of right artificial shoulder joint: Secondary | ICD-10-CM | POA: Diagnosis not present

## 2024-05-03 DIAGNOSIS — Z471 Aftercare following joint replacement surgery: Secondary | ICD-10-CM | POA: Diagnosis not present

## 2024-05-03 DIAGNOSIS — Z6832 Body mass index (BMI) 32.0-32.9, adult: Secondary | ICD-10-CM | POA: Diagnosis not present

## 2024-05-03 DIAGNOSIS — R112 Nausea with vomiting, unspecified: Secondary | ICD-10-CM | POA: Diagnosis not present

## 2024-05-03 DIAGNOSIS — Y792 Prosthetic and other implants, materials and accessory orthopedic devices associated with adverse incidents: Secondary | ICD-10-CM | POA: Diagnosis not present

## 2024-05-03 DIAGNOSIS — G8918 Other acute postprocedural pain: Secondary | ICD-10-CM | POA: Diagnosis not present

## 2024-05-04 DIAGNOSIS — E669 Obesity, unspecified: Secondary | ICD-10-CM | POA: Diagnosis not present

## 2024-05-04 DIAGNOSIS — Z6832 Body mass index (BMI) 32.0-32.9, adult: Secondary | ICD-10-CM | POA: Diagnosis not present

## 2024-05-04 DIAGNOSIS — Y792 Prosthetic and other implants, materials and accessory orthopedic devices associated with adverse incidents: Secondary | ICD-10-CM | POA: Diagnosis not present

## 2024-05-04 DIAGNOSIS — T84038A Mechanical loosening of other internal prosthetic joint, initial encounter: Secondary | ICD-10-CM | POA: Diagnosis not present

## 2024-05-04 DIAGNOSIS — G8918 Other acute postprocedural pain: Secondary | ICD-10-CM | POA: Diagnosis not present

## 2024-05-04 DIAGNOSIS — R112 Nausea with vomiting, unspecified: Secondary | ICD-10-CM | POA: Diagnosis not present

## 2024-05-09 DIAGNOSIS — M545 Low back pain, unspecified: Secondary | ICD-10-CM | POA: Diagnosis not present

## 2024-05-09 DIAGNOSIS — M25511 Pain in right shoulder: Secondary | ICD-10-CM | POA: Diagnosis not present

## 2024-05-09 DIAGNOSIS — I2510 Atherosclerotic heart disease of native coronary artery without angina pectoris: Secondary | ICD-10-CM | POA: Diagnosis not present

## 2024-05-09 DIAGNOSIS — F419 Anxiety disorder, unspecified: Secondary | ICD-10-CM | POA: Diagnosis not present

## 2024-05-09 DIAGNOSIS — Z79899 Other long term (current) drug therapy: Secondary | ICD-10-CM | POA: Diagnosis not present

## 2024-05-09 DIAGNOSIS — E7800 Pure hypercholesterolemia, unspecified: Secondary | ICD-10-CM | POA: Diagnosis not present

## 2024-05-09 DIAGNOSIS — R0602 Shortness of breath: Secondary | ICD-10-CM | POA: Diagnosis not present

## 2024-05-09 DIAGNOSIS — R0789 Other chest pain: Secondary | ICD-10-CM | POA: Diagnosis not present

## 2024-05-09 DIAGNOSIS — J9811 Atelectasis: Secondary | ICD-10-CM | POA: Diagnosis not present

## 2024-05-09 DIAGNOSIS — N281 Cyst of kidney, acquired: Secondary | ICD-10-CM | POA: Diagnosis not present

## 2024-05-09 DIAGNOSIS — K5730 Diverticulosis of large intestine without perforation or abscess without bleeding: Secondary | ICD-10-CM | POA: Diagnosis not present

## 2024-05-09 DIAGNOSIS — K5790 Diverticulosis of intestine, part unspecified, without perforation or abscess without bleeding: Secondary | ICD-10-CM | POA: Diagnosis not present

## 2024-05-09 DIAGNOSIS — I200 Unstable angina: Secondary | ICD-10-CM | POA: Diagnosis not present

## 2024-05-09 DIAGNOSIS — R079 Chest pain, unspecified: Secondary | ICD-10-CM | POA: Diagnosis not present

## 2024-05-09 DIAGNOSIS — K760 Fatty (change of) liver, not elsewhere classified: Secondary | ICD-10-CM | POA: Diagnosis not present

## 2024-05-09 DIAGNOSIS — I700 Atherosclerosis of aorta: Secondary | ICD-10-CM | POA: Diagnosis not present

## 2024-05-09 DIAGNOSIS — Z96611 Presence of right artificial shoulder joint: Secondary | ICD-10-CM | POA: Diagnosis not present

## 2024-05-10 DIAGNOSIS — R079 Chest pain, unspecified: Secondary | ICD-10-CM | POA: Diagnosis not present

## 2024-05-11 DIAGNOSIS — M25511 Pain in right shoulder: Secondary | ICD-10-CM | POA: Diagnosis not present

## 2024-05-15 DIAGNOSIS — M25511 Pain in right shoulder: Secondary | ICD-10-CM | POA: Diagnosis not present

## 2024-05-16 DIAGNOSIS — Z96611 Presence of right artificial shoulder joint: Secondary | ICD-10-CM | POA: Diagnosis not present

## 2024-05-17 DIAGNOSIS — M25511 Pain in right shoulder: Secondary | ICD-10-CM | POA: Diagnosis not present

## 2024-05-21 DIAGNOSIS — M25511 Pain in right shoulder: Secondary | ICD-10-CM | POA: Diagnosis not present

## 2024-05-21 DIAGNOSIS — Z96619 Presence of unspecified artificial shoulder joint: Secondary | ICD-10-CM | POA: Diagnosis not present

## 2024-05-21 DIAGNOSIS — T8459XA Infection and inflammatory reaction due to other internal joint prosthesis, initial encounter: Secondary | ICD-10-CM | POA: Diagnosis not present

## 2024-05-21 DIAGNOSIS — A498 Other bacterial infections of unspecified site: Secondary | ICD-10-CM | POA: Diagnosis not present

## 2024-05-21 DIAGNOSIS — Z1330 Encounter for screening examination for mental health and behavioral disorders, unspecified: Secondary | ICD-10-CM | POA: Diagnosis not present

## 2024-05-21 DIAGNOSIS — R7989 Other specified abnormal findings of blood chemistry: Secondary | ICD-10-CM | POA: Diagnosis not present

## 2024-05-23 DIAGNOSIS — M25511 Pain in right shoulder: Secondary | ICD-10-CM | POA: Diagnosis not present

## 2024-05-25 DIAGNOSIS — M2041 Other hammer toe(s) (acquired), right foot: Secondary | ICD-10-CM | POA: Diagnosis not present

## 2024-05-25 DIAGNOSIS — M79671 Pain in right foot: Secondary | ICD-10-CM | POA: Diagnosis not present

## 2024-05-28 DIAGNOSIS — M25511 Pain in right shoulder: Secondary | ICD-10-CM | POA: Diagnosis not present

## 2024-05-30 DIAGNOSIS — M25511 Pain in right shoulder: Secondary | ICD-10-CM | POA: Diagnosis not present

## 2024-06-04 DIAGNOSIS — Z96619 Presence of unspecified artificial shoulder joint: Secondary | ICD-10-CM | POA: Diagnosis not present

## 2024-06-04 DIAGNOSIS — T8459XA Infection and inflammatory reaction due to other internal joint prosthesis, initial encounter: Secondary | ICD-10-CM | POA: Diagnosis not present

## 2024-06-04 DIAGNOSIS — M25511 Pain in right shoulder: Secondary | ICD-10-CM | POA: Diagnosis not present

## 2024-06-04 DIAGNOSIS — R7989 Other specified abnormal findings of blood chemistry: Secondary | ICD-10-CM | POA: Diagnosis not present

## 2024-06-04 DIAGNOSIS — A498 Other bacterial infections of unspecified site: Secondary | ICD-10-CM | POA: Diagnosis not present

## 2024-06-06 DIAGNOSIS — M25511 Pain in right shoulder: Secondary | ICD-10-CM | POA: Diagnosis not present

## 2024-06-10 ENCOUNTER — Other Ambulatory Visit: Payer: Self-pay | Admitting: Physician Assistant

## 2024-06-10 DIAGNOSIS — M2550 Pain in unspecified joint: Secondary | ICD-10-CM

## 2024-06-10 DIAGNOSIS — F430 Acute stress reaction: Secondary | ICD-10-CM

## 2024-06-18 DIAGNOSIS — M25511 Pain in right shoulder: Secondary | ICD-10-CM | POA: Diagnosis not present

## 2024-06-20 DIAGNOSIS — M25511 Pain in right shoulder: Secondary | ICD-10-CM | POA: Diagnosis not present

## 2024-06-25 DIAGNOSIS — M25511 Pain in right shoulder: Secondary | ICD-10-CM | POA: Diagnosis not present

## 2024-06-28 DIAGNOSIS — M25511 Pain in right shoulder: Secondary | ICD-10-CM | POA: Diagnosis not present

## 2024-07-02 DIAGNOSIS — M25511 Pain in right shoulder: Secondary | ICD-10-CM | POA: Diagnosis not present

## 2024-07-04 DIAGNOSIS — M25511 Pain in right shoulder: Secondary | ICD-10-CM | POA: Diagnosis not present

## 2024-07-09 DIAGNOSIS — M25511 Pain in right shoulder: Secondary | ICD-10-CM | POA: Diagnosis not present

## 2024-07-11 DIAGNOSIS — A498 Other bacterial infections of unspecified site: Secondary | ICD-10-CM | POA: Diagnosis not present

## 2024-07-11 DIAGNOSIS — Z96619 Presence of unspecified artificial shoulder joint: Secondary | ICD-10-CM | POA: Diagnosis not present

## 2024-07-11 DIAGNOSIS — T8459XD Infection and inflammatory reaction due to other internal joint prosthesis, subsequent encounter: Secondary | ICD-10-CM | POA: Diagnosis not present

## 2024-07-11 DIAGNOSIS — M25511 Pain in right shoulder: Secondary | ICD-10-CM | POA: Diagnosis not present

## 2024-07-16 DIAGNOSIS — M25511 Pain in right shoulder: Secondary | ICD-10-CM | POA: Diagnosis not present

## 2024-07-18 DIAGNOSIS — M25511 Pain in right shoulder: Secondary | ICD-10-CM | POA: Diagnosis not present

## 2024-07-23 DIAGNOSIS — M25511 Pain in right shoulder: Secondary | ICD-10-CM | POA: Diagnosis not present

## 2024-07-25 DIAGNOSIS — M25511 Pain in right shoulder: Secondary | ICD-10-CM | POA: Diagnosis not present

## 2024-07-30 DIAGNOSIS — M25511 Pain in right shoulder: Secondary | ICD-10-CM | POA: Diagnosis not present

## 2024-08-01 DIAGNOSIS — M25511 Pain in right shoulder: Secondary | ICD-10-CM | POA: Diagnosis not present

## 2024-08-08 DIAGNOSIS — M25511 Pain in right shoulder: Secondary | ICD-10-CM | POA: Diagnosis not present

## 2024-08-14 DIAGNOSIS — M25511 Pain in right shoulder: Secondary | ICD-10-CM | POA: Diagnosis not present

## 2024-08-17 DIAGNOSIS — M25511 Pain in right shoulder: Secondary | ICD-10-CM | POA: Diagnosis not present
